# Patient Record
Sex: Female | Born: 1935 | Race: White | Hispanic: No | Smoking: Former smoker
Health system: Southern US, Community
[De-identification: ages and names within clinical notes are randomized; demographics above are authoritative.]

## PROBLEM LIST (undated history)

## (undated) DIAGNOSIS — C801 Malignant (primary) neoplasm, unspecified: Secondary | ICD-10-CM

## (undated) DIAGNOSIS — J449 Chronic obstructive pulmonary disease, unspecified: Secondary | ICD-10-CM

## (undated) HISTORY — PX: CHOLECYSTECTOMY: SHX55

## (undated) HISTORY — PX: ABDOMINAL HYSTERECTOMY: SHX81

## (undated) HISTORY — PX: COLOSTOMY: SHX63

---

## 2018-02-02 ENCOUNTER — Emergency Department

## 2018-02-02 ENCOUNTER — Inpatient Hospital Stay
Admission: EM | Admit: 2018-02-02 | Discharge: 2018-02-06 | DRG: 388 | Disposition: A | Discharge status: Hospice | Attending: Internal Medicine | Admitting: Internal Medicine

## 2018-02-02 ENCOUNTER — Encounter: Payer: Self-pay | Admitting: Radiology

## 2018-02-02 ENCOUNTER — Other Ambulatory Visit: Payer: Self-pay

## 2018-02-02 DIAGNOSIS — L89151 Pressure ulcer of sacral region, stage 1: Secondary | ICD-10-CM | POA: Diagnosis present

## 2018-02-02 DIAGNOSIS — Z7952 Long term (current) use of systemic steroids: Secondary | ICD-10-CM

## 2018-02-02 DIAGNOSIS — K56609 Unspecified intestinal obstruction, unspecified as to partial versus complete obstruction: Secondary | ICD-10-CM | POA: Diagnosis not present

## 2018-02-02 DIAGNOSIS — K529 Noninfective gastroenteritis and colitis, unspecified: Secondary | ICD-10-CM | POA: Diagnosis present

## 2018-02-02 DIAGNOSIS — Z85048 Personal history of other malignant neoplasm of rectum, rectosigmoid junction, and anus: Secondary | ICD-10-CM

## 2018-02-02 DIAGNOSIS — R1084 Generalized abdominal pain: Secondary | ICD-10-CM

## 2018-02-02 DIAGNOSIS — Z681 Body mass index (BMI) 19 or less, adult: Secondary | ICD-10-CM

## 2018-02-02 DIAGNOSIS — Z933 Colostomy status: Secondary | ICD-10-CM

## 2018-02-02 DIAGNOSIS — Z79899 Other long term (current) drug therapy: Secondary | ICD-10-CM

## 2018-02-02 DIAGNOSIS — Z87891 Personal history of nicotine dependence: Secondary | ICD-10-CM

## 2018-02-02 DIAGNOSIS — Z66 Do not resuscitate: Secondary | ICD-10-CM | POA: Diagnosis present

## 2018-02-02 DIAGNOSIS — L89221 Pressure ulcer of left hip, stage 1: Secondary | ICD-10-CM | POA: Diagnosis present

## 2018-02-02 DIAGNOSIS — E860 Dehydration: Secondary | ICD-10-CM | POA: Diagnosis present

## 2018-02-02 DIAGNOSIS — R112 Nausea with vomiting, unspecified: Secondary | ICD-10-CM

## 2018-02-02 DIAGNOSIS — L8990 Pressure ulcer of unspecified site, unspecified stage: Secondary | ICD-10-CM

## 2018-02-02 DIAGNOSIS — E43 Unspecified severe protein-calorie malnutrition: Secondary | ICD-10-CM | POA: Diagnosis present

## 2018-02-02 DIAGNOSIS — J449 Chronic obstructive pulmonary disease, unspecified: Secondary | ICD-10-CM | POA: Diagnosis present

## 2018-02-02 DIAGNOSIS — Z7951 Long term (current) use of inhaled steroids: Secondary | ICD-10-CM

## 2018-02-02 DIAGNOSIS — R0902 Hypoxemia: Secondary | ICD-10-CM | POA: Diagnosis present

## 2018-02-02 DIAGNOSIS — E876 Hypokalemia: Secondary | ICD-10-CM | POA: Diagnosis present

## 2018-02-02 DIAGNOSIS — D638 Anemia in other chronic diseases classified elsewhere: Secondary | ICD-10-CM | POA: Diagnosis present

## 2018-02-02 DIAGNOSIS — C20 Malignant neoplasm of rectum: Secondary | ICD-10-CM | POA: Diagnosis present

## 2018-02-02 DIAGNOSIS — Z9981 Dependence on supplemental oxygen: Secondary | ICD-10-CM

## 2018-02-02 DIAGNOSIS — R1110 Vomiting, unspecified: Secondary | ICD-10-CM | POA: Diagnosis not present

## 2018-02-02 DIAGNOSIS — J180 Bronchopneumonia, unspecified organism: Secondary | ICD-10-CM | POA: Diagnosis present

## 2018-02-02 DIAGNOSIS — L89211 Pressure ulcer of right hip, stage 1: Secondary | ICD-10-CM | POA: Diagnosis present

## 2018-02-02 HISTORY — DX: Malignant (primary) neoplasm, unspecified: C80.1

## 2018-02-02 LAB — COMPREHENSIVE METABOLIC PANEL
ALT: 31 U/L (ref 0–44)
ANION GAP: 8 (ref 5–15)
AST: 25 U/L (ref 15–41)
Albumin: 3.3 g/dL — ABNORMAL LOW (ref 3.5–5.0)
Alkaline Phosphatase: 98 U/L (ref 38–126)
BILIRUBIN TOTAL: 0.6 mg/dL (ref 0.3–1.2)
BUN: 16 mg/dL (ref 8–23)
CALCIUM: 7.7 mg/dL — AB (ref 8.9–10.3)
CHLORIDE: 92 mmol/L — AB (ref 98–111)
CO2: 40 mmol/L — AB (ref 22–32)
Creatinine, Ser: 0.6 mg/dL (ref 0.44–1.00)
GFR calc Af Amer: >60 mL/min (ref >60)
Glucose, Bld: 127 mg/dL — ABNORMAL HIGH (ref 70–99)
Potassium: 3.1 mmol/L — ABNORMAL LOW (ref 3.5–5.1)
Sodium: 140 mmol/L (ref 135–145)
Total Protein: 6.6 g/dL (ref 6.5–8.1)

## 2018-02-02 LAB — CBC WITH DIFFERENTIAL/PLATELET
BASOS ABS: 0 10*3/uL (ref 0–0.1)
Basophils Relative: 0 %
Eosinophils Absolute: 0 10*3/uL (ref 0–0.7)
Eosinophils Relative: 0 %
HEMATOCRIT: 34.6 % — AB (ref 35.0–47.0)
Hemoglobin: 11.3 g/dL — ABNORMAL LOW (ref 12.0–16.0)
LYMPHS ABS: 0.7 10*3/uL — AB (ref 1.0–3.6)
Lymphocytes Relative: 4 %
MCH: 26.1 pg (ref 26.0–34.0)
MCHC: 32.7 g/dL (ref 32.0–36.0)
MCV: 80 fL (ref 80.0–100.0)
MONO ABS: 1.1 10*3/uL — AB (ref 0.2–0.9)
Monocytes Relative: 6 %
NEUTROS ABS: 16.5 10*3/uL — AB (ref 1.4–6.5)
Neutrophils Relative %: 90 %
Platelets: 290 10*3/uL (ref 150–440)
RBC: 4.32 MIL/uL (ref 3.80–5.20)
RDW: 15.1 % — AB (ref 11.5–14.5)
WBC: 18.3 10*3/uL — ABNORMAL HIGH (ref 3.6–11.0)

## 2018-02-02 LAB — LIPASE, BLOOD: LIPASE: 34 U/L (ref 11–51)

## 2018-02-02 LAB — LACTIC ACID, PLASMA: LACTIC ACID, VENOUS: 0.8 mmol/L (ref 0.5–1.9)

## 2018-02-02 MED ORDER — IOHEXOL 300 MG/ML  SOLN
75.0000 mL | Freq: Once | INTRAMUSCULAR | Status: AC | PRN
  Administered 2018-02-02: 75 mL via INTRAVENOUS

## 2018-02-02 MED ORDER — ONDANSETRON HCL 4 MG/2ML IJ SOLN
4.0000 mg | Freq: Once | INTRAMUSCULAR | Status: AC
  Administered 2018-02-02: 4 mg via INTRAVENOUS
  Filled 2018-02-02: qty 2

## 2018-02-02 MED ORDER — SODIUM CHLORIDE 0.9 % IV BOLUS
1000.0000 mL | Freq: Once | INTRAVENOUS | Status: AC
  Administered 2018-02-02: 1000 mL via INTRAVENOUS

## 2018-02-02 NOTE — ED Provider Notes (Signed)
[facility] Emergency Department Provider Note  ____________________________________________  Time seen: Approximately 10:56 PM  I have reviewed the triage vital signs and the nursing notes.   HISTORY  Chief Complaint Emesis   HPI Erin Horn is a 84 y.o. female with h/o rectal cancer on hospice who presents for evaluation of abdominal pain.  Patient was sent here by her  hospice nurse for concerns of an SBO.  Patient has had several episodes of nonbloody nonbilious emesis that started this afternoon, associated with diffuse intermittent sharp cramping abdominal pain and decreased ostomy output.  Patient describes no ostomy output since this morning.  No fever or chills.  At this time she reports that her pain is very mild.  No prior history of SBO.  No dysuria or hematuria.  Patient denies chest pain or shortness of breath.  She has had a mild cough which is dry.  PMH Rectal cancer  Allergies Patient has no allergy information on record.  No family history on file.  Social History Smoking - none Alcohol - no Drugs - no  Review of Systems  Constitutional: Negative for fever. Eyes: Negative for visual changes. ENT: Negative for sore throat. Neck: No neck pain  Cardiovascular: Negative for chest pain. Respiratory: Negative for shortness of breath. + cough Gastrointestinal: + abdominal pain, vomiting, and decreased ostomy output. No diarrhea. Genitourinary: Negative for dysuria. Musculoskeletal: Negative for back pain. Skin: Negative for rash. Neurological: Negative for headaches, weakness or numbness. Psych: No SI or HI  ____________________________________________   PHYSICAL EXAM:  VITAL SIGNS: ED Triage Vitals [02/02/18 2154]  Enc Vitals Group     BP (!) 143/64     Pulse Rate (!) 112     Resp 20     Temp 98.6 F (37 C)     Temp Source Oral     SpO2 90 %     Weight 95 lb (43.1 kg)     Height 5\' 3"  (1.6 m)     Head Circumference        Peak Flow      Pain Score 5     Pain Loc      Pain Edu?      Excl. in [place]?     Constitutional: Alert and oriented. Well appearing and in no apparent distress. HEENT:      Head: Normocephalic and atraumatic.         Eyes: Conjunctivae are normal. Sclera is non-icteric.       Mouth/Throat: Mucous membranes are moist.       Neck: Supple with no signs of meningismus. Cardiovascular: Tachycardic with regular rhythm. No murmurs, gallops, or rubs. 2+ symmetrical distal pulses are present in all extremities. No JVD. Respiratory: Normal respiratory effort. Lungs are clear to auscultation bilaterally. No wheezes, crackles, or rhonchi.  Gastrointestinal: Soft, non tender, and non distended with positive bowel sounds. No rebound or guarding. Ostomy bag with no stool Musculoskeletal: Nontender with normal range of motion in all extremities. No edema, cyanosis, or erythema of extremities. Neurologic: Normal speech and language. Face is symmetric. Moving all extremities. No gross focal neurologic deficits are appreciated. Skin: Skin is warm, dry and intact. No rash noted. Psychiatric: Mood and affect are normal. Speech and behavior are normal.  ____________________________________________   LABS (all labs ordered are listed, but only abnormal results are displayed)  Labs Reviewed  CBC WITH DIFFERENTIAL/PLATELET - Abnormal; Notable for the following components:      Result Value  WBC 18.3 (*)    Hemoglobin 11.3 (*)    HCT 34.6 (*)    RDW 15.1 (*)    Neutro Abs 16.5 (*)    Lymphs Abs 0.7 (*)    Monocytes Absolute 1.1 (*)    All other components within normal limits  COMPREHENSIVE METABOLIC PANEL  LIPASE, BLOOD  LACTIC ACID, PLASMA  URINALYSIS, COMPLETE (UACMP) WITH MICROSCOPIC   ____________________________________________  EKG  ED ECG REPORT I, Rudene Re, the attending physician, personally viewed and interpreted this ECG.  Sinus tachycardia, rate of 102, normal  intervals, LVH, normal axis, no ST elevations or depressions.  No prior for comparison. ____________________________________________  RADIOLOGY  CT a/p: PND ____________________________________________   PROCEDURES  Procedure(s) performed: None Procedures Critical Care performed:  None ____________________________________________   INITIAL IMPRESSION / ASSESSMENT AND PLAN / ED COURSE  84 y.o. female with h/o rectal cancer on hospice who presents for evaluation of abdominal pain, nausea, several episodes of nonbloody nonbilious emesis and decreased ostomy output.  Patient is tachycardic but otherwise vitals are within normal limits, abdomen is flat with positive bowel sounds and no tenderness to palpation.  Ostomy bag is empty.  EKG showing sinus tachycardia with no evidence of ischemia.  Labs including CBC, CMP, lipase, UA, and lactic are pending. Will give zofran and IVF. CT a/p pending to eval for SBO. Care transferred to Dr. Dahlia Client      As part of my medical decision making, I reviewed the following data within the [place] notes reviewed and incorporated, Labs reviewed , EKG interpreted , Old chart reviewed, Notes from prior ED visits and [place] Controlled Substance Database    Pertinent labs & imaging results that were available during my care of the patient were reviewed by me and considered in my medical decision making (see chart for details).    ____________________________________________   FINAL CLINICAL IMPRESSION(S) / ED DIAGNOSES  Final diagnoses:  Generalized abdominal pain  Non-intractable vomiting with nausea, unspecified vomiting type      NEW MEDICATIONS STARTED DURING THIS VISIT:  ED Discharge Orders    None       Note:  This document was prepared using Dragon voice recognition software and may include unintentional dictation errors.    Alfred Levins, [place], MD 02/02/18 [phone]

## 2018-02-02 NOTE — ED Notes (Signed)
Resumed care from samnatha rn.  Pt alert. Family with pt.  Iv in place.

## 2018-02-02 NOTE — ED Triage Notes (Signed)
Pt in with co vomiting since today and mid abd pain. Pt has a colostomy due to rectal cancer, states has had decreased stool noted in bag.  Sent here by hospice nurse for possible bowel obstruction.

## 2018-02-02 NOTE — ED Notes (Signed)
Report given to Amy RN.

## 2018-02-02 NOTE — ED Notes (Signed)
Patient transported to CT 

## 2018-02-03 ENCOUNTER — Inpatient Hospital Stay

## 2018-02-03 ENCOUNTER — Encounter: Payer: Self-pay | Admitting: Internal Medicine

## 2018-02-03 DIAGNOSIS — R0902 Hypoxemia: Secondary | ICD-10-CM | POA: Diagnosis present

## 2018-02-03 DIAGNOSIS — E43 Unspecified severe protein-calorie malnutrition: Secondary | ICD-10-CM | POA: Diagnosis present

## 2018-02-03 DIAGNOSIS — Z87891 Personal history of nicotine dependence: Secondary | ICD-10-CM | POA: Diagnosis not present

## 2018-02-03 DIAGNOSIS — C20 Malignant neoplasm of rectum: Secondary | ICD-10-CM | POA: Diagnosis present

## 2018-02-03 DIAGNOSIS — E876 Hypokalemia: Secondary | ICD-10-CM | POA: Diagnosis present

## 2018-02-03 DIAGNOSIS — Z9981 Dependence on supplemental oxygen: Secondary | ICD-10-CM | POA: Diagnosis not present

## 2018-02-03 DIAGNOSIS — D638 Anemia in other chronic diseases classified elsewhere: Secondary | ICD-10-CM | POA: Diagnosis present

## 2018-02-03 DIAGNOSIS — K56609 Unspecified intestinal obstruction, unspecified as to partial versus complete obstruction: Secondary | ICD-10-CM | POA: Diagnosis present

## 2018-02-03 DIAGNOSIS — Z85048 Personal history of other malignant neoplasm of rectum, rectosigmoid junction, and anus: Secondary | ICD-10-CM | POA: Diagnosis not present

## 2018-02-03 DIAGNOSIS — L89151 Pressure ulcer of sacral region, stage 1: Secondary | ICD-10-CM | POA: Diagnosis present

## 2018-02-03 DIAGNOSIS — Z681 Body mass index (BMI) 19 or less, adult: Secondary | ICD-10-CM | POA: Diagnosis not present

## 2018-02-03 DIAGNOSIS — J180 Bronchopneumonia, unspecified organism: Secondary | ICD-10-CM | POA: Diagnosis present

## 2018-02-03 DIAGNOSIS — Z7952 Long term (current) use of systemic steroids: Secondary | ICD-10-CM | POA: Diagnosis not present

## 2018-02-03 DIAGNOSIS — Z933 Colostomy status: Secondary | ICD-10-CM | POA: Diagnosis not present

## 2018-02-03 DIAGNOSIS — Z79899 Other long term (current) drug therapy: Secondary | ICD-10-CM | POA: Diagnosis not present

## 2018-02-03 DIAGNOSIS — J449 Chronic obstructive pulmonary disease, unspecified: Secondary | ICD-10-CM | POA: Diagnosis present

## 2018-02-03 DIAGNOSIS — K529 Noninfective gastroenteritis and colitis, unspecified: Secondary | ICD-10-CM | POA: Diagnosis present

## 2018-02-03 DIAGNOSIS — R1110 Vomiting, unspecified: Secondary | ICD-10-CM | POA: Diagnosis present

## 2018-02-03 DIAGNOSIS — E860 Dehydration: Secondary | ICD-10-CM | POA: Diagnosis present

## 2018-02-03 DIAGNOSIS — L89221 Pressure ulcer of left hip, stage 1: Secondary | ICD-10-CM | POA: Diagnosis present

## 2018-02-03 DIAGNOSIS — Z66 Do not resuscitate: Secondary | ICD-10-CM | POA: Diagnosis present

## 2018-02-03 DIAGNOSIS — L8990 Pressure ulcer of unspecified site, unspecified stage: Secondary | ICD-10-CM

## 2018-02-03 DIAGNOSIS — Z7951 Long term (current) use of inhaled steroids: Secondary | ICD-10-CM | POA: Diagnosis not present

## 2018-02-03 DIAGNOSIS — L89211 Pressure ulcer of right hip, stage 1: Secondary | ICD-10-CM | POA: Diagnosis present

## 2018-02-03 LAB — URINALYSIS, COMPLETE (UACMP) WITH MICROSCOPIC
Bacteria, UA: NONE SEEN
Bilirubin Urine: NEGATIVE
Glucose, UA: NEGATIVE mg/dL
Ketones, ur: NEGATIVE mg/dL
Nitrite: NEGATIVE
Protein, ur: 30 mg/dL — AB
Specific Gravity, Urine: 1.038 — ABNORMAL HIGH (ref 1.005–1.030)
pH: 6 (ref 5.0–8.0)

## 2018-02-03 LAB — PREALBUMIN: Prealbumin: 16.2 mg/dL — ABNORMAL LOW (ref 18–38)

## 2018-02-03 LAB — MAGNESIUM: Magnesium: 1.1 mg/dL — ABNORMAL LOW (ref 1.7–2.4)

## 2018-02-03 MED ORDER — SENNOSIDES-DOCUSATE SODIUM 8.6-50 MG PO TABS: 1.0000 | ORAL_TABLET | Freq: Every evening | ORAL | Status: DC | PRN

## 2018-02-03 MED ORDER — IPRATROPIUM-ALBUTEROL 0.5-2.5 (3) MG/3ML IN SOLN
3.0000 mL | RESPIRATORY_TRACT | Status: DC | PRN
  Administered 2018-02-03 – 2018-02-06 (×7): 3 mL via RESPIRATORY_TRACT
  Filled 2018-02-03 (×7): qty 3

## 2018-02-03 MED ORDER — ENOXAPARIN SODIUM 30 MG/0.3ML ~~LOC~~ SOLN: 30.0000 mg | SUBCUTANEOUS | Status: DC

## 2018-02-03 MED ORDER — LORAZEPAM 2 MG/ML IJ SOLN
0.5000 mg | INTRAMUSCULAR | Status: DC | PRN
  Administered 2018-02-04: 03:00:00 1 mg via INTRAVENOUS
  Administered 2018-02-05: 0.5 mg via INTRAVENOUS
  Filled 2018-02-03 (×2): qty 1

## 2018-02-03 MED ORDER — POTASSIUM CHLORIDE 10 MEQ/100ML IV SOLN
10.0000 meq | INTRAVENOUS | Status: AC
  Administered 2018-02-03 (×4): 10 meq via INTRAVENOUS
  Filled 2018-02-03 (×3): qty 100

## 2018-02-03 MED ORDER — LORAZEPAM 0.5 MG PO TABS: 0.5000 mg | ORAL_TABLET | ORAL | Status: DC | PRN

## 2018-02-03 MED ORDER — MORPHINE SULFATE ER 15 MG PO TBCR: 15.0000 mg | EXTENDED_RELEASE_TABLET | Freq: Three times a day (TID) | ORAL | Status: DC

## 2018-02-03 MED ORDER — CIPROFLOXACIN IN D5W 200 MG/100ML IV SOLN
200.0000 mg | Freq: Two times a day (BID) | INTRAVENOUS | Status: DC
  Administered 2018-02-03 – 2018-02-05 (×4): 200 mg via INTRAVENOUS
  Filled 2018-02-03 (×5): qty 100

## 2018-02-03 MED ORDER — ENOXAPARIN SODIUM 40 MG/0.4ML ~~LOC~~ SOLN: 40.0000 mg | SUBCUTANEOUS | Status: DC

## 2018-02-03 MED ORDER — HYDROMORPHONE HCL 1 MG/ML IJ SOLN
0.5000 mg | INTRAMUSCULAR | Status: DC | PRN
  Administered 2018-02-03 – 2018-02-05 (×12): 1 mg via INTRAVENOUS
  Filled 2018-02-03 (×12): qty 1

## 2018-02-03 MED ORDER — ACETAMINOPHEN 650 MG RE SUPP: 650.0000 mg | Freq: Four times a day (QID) | RECTAL | Status: DC | PRN

## 2018-02-03 MED ORDER — ORAL CARE MOUTH RINSE
15.0000 mL | Freq: Two times a day (BID) | OROMUCOSAL | Status: DC
  Administered 2018-02-03 – 2018-02-04 (×3): 15 mL via OROMUCOSAL

## 2018-02-03 MED ORDER — GABAPENTIN 100 MG PO CAPS: 200.0000 mg | ORAL_CAPSULE | Freq: Two times a day (BID) | ORAL | Status: DC

## 2018-02-03 MED ORDER — AMLODIPINE BESYLATE 10 MG PO TABS: 10.0000 mg | ORAL_TABLET | Freq: Every day | ORAL | Status: DC

## 2018-02-03 MED ORDER — LACTATED RINGERS IV SOLN
INTRAVENOUS | Status: DC
  Administered 2018-02-03: 04:00:00 via INTRAVENOUS

## 2018-02-03 MED ORDER — MONTELUKAST SODIUM 10 MG PO TABS: 10.0000 mg | ORAL_TABLET | Freq: Every day | ORAL | Status: DC

## 2018-02-03 MED ORDER — AMPICILLIN-SULBACTAM SODIUM 1.5 (1-0.5) G IJ SOLR
1.5000 g | Freq: Three times a day (TID) | INTRAMUSCULAR | Status: DC
  Filled 2018-02-03 (×3): qty 1.5

## 2018-02-03 MED ORDER — POTASSIUM CHLORIDE 10 MEQ/100ML IV SOLN
10.0000 meq | INTRAVENOUS | Status: AC
  Administered 2018-02-03 (×2): 10 meq via INTRAVENOUS
  Filled 2018-02-03 (×2): qty 100

## 2018-02-03 MED ORDER — HALOPERIDOL LACTATE 2 MG/ML PO CONC: 1.0000 mg | ORAL | Status: DC | PRN

## 2018-02-03 MED ORDER — METRONIDAZOLE IN NACL 5-0.79 MG/ML-% IV SOLN
500.0000 mg | Freq: Three times a day (TID) | INTRAVENOUS | Status: DC
  Administered 2018-02-03 – 2018-02-05 (×6): 500 mg via INTRAVENOUS
  Filled 2018-02-03 (×8): qty 100

## 2018-02-03 MED ORDER — MORPHINE SULFATE (CONCENTRATE) 10 MG/0.5ML PO SOLN
10.0000 mg | ORAL | Status: DC | PRN
Start: 2018-02-03 — End: 2018-02-03

## 2018-02-03 MED ORDER — MAGNESIUM SULFATE 4 GM/100ML IV SOLN
4.0000 g | Freq: Once | INTRAVENOUS | Status: AC
  Administered 2018-02-03: 14:00:00 4 g via INTRAVENOUS
  Filled 2018-02-03: qty 100

## 2018-02-03 MED ORDER — ACETAMINOPHEN 325 MG PO TABS: 650.0000 mg | ORAL_TABLET | Freq: Four times a day (QID) | ORAL | Status: DC | PRN

## 2018-02-03 MED ORDER — PREDNISONE 20 MG PO TABS: 20.0000 mg | ORAL_TABLET | Freq: Every day | ORAL | Status: DC

## 2018-02-03 MED ORDER — ORAL CARE MOUTH RINSE
15.0000 mL | Freq: Two times a day (BID) | OROMUCOSAL | Status: DC
  Administered 2018-02-03 (×2): 15 mL via OROMUCOSAL

## 2018-02-03 MED ORDER — BISACODYL 5 MG PO TBEC: 5.0000 mg | DELAYED_RELEASE_TABLET | Freq: Every day | ORAL | Status: DC | PRN

## 2018-02-03 MED ORDER — ONDANSETRON HCL 4 MG PO TABS: 4.0000 mg | ORAL_TABLET | Freq: Four times a day (QID) | ORAL | Status: DC | PRN

## 2018-02-03 MED ORDER — ONDANSETRON HCL 4 MG/2ML IJ SOLN
4.0000 mg | Freq: Four times a day (QID) | INTRAMUSCULAR | Status: DC | PRN
  Administered 2018-02-05: 4 mg via INTRAVENOUS
  Filled 2018-02-03 (×2): qty 2

## 2018-02-03 MED ORDER — HYOSCYAMINE SULFATE 0.125 MG SL SUBL
0.1250 mg | SUBLINGUAL_TABLET | SUBLINGUAL | Status: DC | PRN
  Filled 2018-02-03: qty 1

## 2018-02-03 MED ORDER — POTASSIUM CHLORIDE CRYS ER 10 MEQ PO TBCR: 10.0000 meq | EXTENDED_RELEASE_TABLET | Freq: Two times a day (BID) | ORAL | Status: DC

## 2018-02-03 NOTE — Progress Notes (Signed)
Lovenox changed to 30 mg daily for TBW <45 kg.

## 2018-02-03 NOTE — Plan of Care (Signed)
  Problem: Education: Goal: Knowledge of General Education information will improve Description Including pain rating scale, medication(s)/side effects and non-pharmacologic comfort measures Outcome: Progressing   Problem: Pain Managment: Goal: General experience of comfort will improve Outcome: Progressing   Problem: Safety: Goal: Ability to remain free from injury will improve Outcome: Progressing   Problem: Skin Integrity: Goal: Risk for impaired skin integrity will decrease Outcome: Progressing   Problem: Coping: Goal: Ability to identify and develop effective coping behavior will improve Outcome: Progressing

## 2018-02-03 NOTE — ED Notes (Signed)
Ng tube inserted without diff into left nares.  Connected to low suction, draining yellow green bile.  Pt tolerated  Well.  Family with pt  Pt waiitng on admission.

## 2018-02-03 NOTE — H&P (Signed)
[facility] NAME: Erin Horn    MR#:  [id number]  DATE OF BIRTH:  06/27/1929  DATE OF ADMISSION:  02/02/2018  PRIMARY CARE PHYSICIAN: Patient, No Pcp Per   REQUESTING/REFERRING PHYSICIAN: Loney Hering, MD  CHIEF COMPLAINT:   Chief Complaint  Patient presents with  . Emesis    HISTORY OF PRESENT ILLNESS:  Erin Horn  is a 84 y.o. female with a known history of COPD (2L [place] chronic home O2), long-standing Hx rectal Ca (w/ colostomy) p/w AP/N/V. Pt lived in [place], and moved to [place] with her family ~3wks ago. She apparently had radiation therapy for rectal mass last year, but refused subsequent chemoTx. As a result, the radiation shrank the mass, but the mass regained size in the interrim. In 08/2017, colostomy was done for comfort. Pt was in rehab x52mo. In the interim, it seems she has been told by her Oncologists that there are no further treatment options available. As such, has been receiving home care, though she has not received a designation of terminal disease w/ 36mo life expectancy (hospice) as yet. Pt's family tells me that over weekend, pt had small qty rectal bleeding as well as bleeding into ostomy bag. She had diffuse crampy AP and N/V episodes xTNTC, (-) blood. Despite N/V, pt has still had good appetite, but has not been able to keep anything down. Vomitus characterized as dark/malodorous, description suggestive of feculent vomiting. CT A/P (+) "Multiple dilated small bowel loops with multiple air-fluid levels and diffuse small bowel wall thickening. Changes likely to indicate small bowel obstruction with enteritis. Transition zone is not identified." NGT placed.  PAST MEDICAL HISTORY:   Past Medical History:  Diagnosis Date  . Cancer ([place])     PAST SURGICAL HISTORY:  History reviewed. No pertinent surgical history.  SOCIAL HISTORY:   Social History   Tobacco Use  . Smoking status: Former Smoker     Types: Cigarettes  . Smokeless tobacco: Never Used  Substance Use Topics  . Alcohol use: Not Currently    FAMILY HISTORY:  History reviewed. No pertinent family history.  DRUG ALLERGIES:  No Known Allergies  REVIEW OF SYSTEMS:   Review of Systems  Constitutional: Negative for chills, diaphoresis, fever, malaise/fatigue and weight loss.  HENT: Negative for congestion, ear pain, hearing loss, nosebleeds, sinus pain, sore throat and tinnitus.   Eyes: Negative for blurred vision, double vision and photophobia.  Respiratory: Negative for cough, hemoptysis, sputum production, shortness of breath and wheezing.   Cardiovascular: Negative for chest pain, palpitations, orthopnea, claudication, leg swelling and PND.  Gastrointestinal: Positive for abdominal pain, melena, nausea and vomiting. Negative for constipation, diarrhea and heartburn.  Genitourinary: Negative for dysuria, flank pain, frequency, hematuria and urgency.  Musculoskeletal: Negative for back pain, joint pain, myalgias and neck pain.  Skin: Negative for itching and rash.  Neurological: Negative for dizziness, tingling, tremors, sensory change, speech change, focal weakness, seizures, loss of consciousness, weakness and headaches.  Psychiatric/Behavioral: Negative for memory loss. The patient does not have insomnia.    MEDICATIONS AT HOME:   Prior to Admission medications   Medication Sig Start Date End Date Taking? Authorizing Provider  acetaminophen (TYLENOL) 325 MG tablet Take 650 mg by mouth every 6 (six) hours as needed.   Yes [provider]  albuterol (PROVENTIL) (2.5 MG/3ML) 0.083% nebulizer solution Take 2.5 mg by nebulization every 6 (six) hours as needed for wheezing or shortness of breath.  Yes [provider]  amLODipine (NORVASC) 10 MG tablet Take 10 mg by mouth daily.   Yes [provider]  fluticasone (FLONASE) 50 MCG/ACT nasal spray Place 2 sprays into both nostrils daily.   Yes  [provider]  furosemide (LASIX) 40 MG tablet Take 40 mg by mouth daily.   Yes [provider]  gabapentin (NEURONTIN) 100 MG capsule Take 200 mg by mouth 2 (two) times daily.   Yes [provider]  haloperidol (HALDOL) 2 MG/ML solution Take 1 mg by mouth every 4 (four) hours as needed for agitation.   Yes [provider]  hyoscyamine (LEVSIN SL) 0.125 MG SL tablet Place 0.125 mg under the tongue every 4 (four) hours as needed.   Yes [provider]  ipratropium (ATROVENT) 0.02 % nebulizer solution Take 0.5 mg by nebulization every 6 (six) hours as needed for wheezing or shortness of breath.   Yes [provider]  loratadine (CLARITIN) 10 MG tablet Take 10 mg by mouth daily.   Yes [provider]  LORazepam (ATIVAN) 0.5 MG tablet Take 0.5 mg by mouth every 4 (four) hours as needed for anxiety.   Yes [provider]  montelukast (SINGULAIR) 10 MG tablet Take 10 mg by mouth at bedtime.   Yes [provider]  morphine (MS CONTIN) 15 MG 12 hr tablet Take 15 mg by mouth 3 (three) times daily.   Yes [provider]  Morphine Sulfate (MORPHINE CONCENTRATE) 10 mg / 0.5 ml concentrated solution Take 10-20 mg by mouth every 2 (two) hours as needed for severe pain.   Yes [provider]  potassium chloride (K-DUR,KLOR-CON) 10 MEQ tablet Take 10 mEq by mouth 2 (two) times daily.   Yes [provider]  predniSONE (DELTASONE) 10 MG tablet Take 20 mg by mouth daily with breakfast.   Yes [provider]  sennosides-docusate sodium (SENOKOT-S) 8.6-50 MG tablet Take 1-2 tablets by mouth daily as needed for constipation.   Yes [provider]      VITAL SIGNS:  Blood pressure (!) 130/58, pulse 94, temperature 98.5 F (36.9 C), temperature source Oral, resp. rate 18, height 5' (1.524 m), weight 42.2 kg (93 lb 1.6 oz), SpO2 94 %.  PHYSICAL EXAMINATION:  Physical Exam  Constitutional: She  is oriented to person, place, and time. She appears well-developed. She appears cachectic. She is active and cooperative.  Non-toxic appearance. She has a sickly appearance. She appears ill. No distress. She is not intubated. Nasal cannula in place.  HENT:  Head: Normocephalic and atraumatic.  Mouth/Throat: Oropharynx is clear and moist. No oropharyngeal exudate.  Eyes: Conjunctivae, EOM and lids are normal. No scleral icterus.  Neck: Neck supple. No JVD present. No thyromegaly present.  Cardiovascular: Normal rate, regular rhythm, S1 normal, S2 normal and normal heart sounds.  No extrasystoles are present. Exam reveals no gallop, no S3, no S4, no distant heart sounds and no friction rub.  No murmur heard. Pulmonary/Chest: Effort normal. No accessory muscle usage or stridor. No apnea, no tachypnea and no bradypnea. She is not intubated. No respiratory distress. She has decreased breath sounds in the right upper field, the right middle field, the right lower field, the left upper field, the left middle field and the left lower field. She has no wheezes. She has no rhonchi. She has no rales.  Abdominal: Soft. She exhibits no distension. Bowel sounds are decreased. There is no tenderness. There is no rebound and no guarding.  (+)  colostomy  Musculoskeletal: Normal range of motion. She exhibits no edema or tenderness.  Lymphadenopathy:    She has no cervical adenopathy.  Neurological: She is alert and oriented to person, place, and time. She is not disoriented.  Skin: Skin is warm and dry. No rash noted. She is not diaphoretic. No erythema.  Psychiatric: She has a normal mood and affect. Her speech is normal and behavior is normal. Judgment and thought content normal. Cognition and memory are normal.   LABORATORY PANEL:   CBC Recent Labs  Lab 02/02/18 2245  WBC 18.3*  HGB 11.3*  HCT 34.6*  PLT 290    ------------------------------------------------------------------------------------------------------------------  Chemistries  Recent Labs  Lab 02/02/18 2245  NA 140  K 3.1*  CL 92*  CO2 40*  GLUCOSE 127*  BUN 16  CREATININE 0.60  CALCIUM 7.7*  AST 25  ALT 31  ALKPHOS 98  BILITOT 0.6   ------------------------------------------------------------------------------------------------------------------  Cardiac Enzymes No results for input(s): TROPONINI in the last 168 hours. ------------------------------------------------------------------------------------------------------------------  RADIOLOGY:  Dg Abdomen 1 View  Result Date: 02/03/2018 CLINICAL DATA:  NG tube placement EXAM: ABDOMEN - 1 VIEW COMPARISON:  None. FINDINGS: The enteric tube is present with tip over the left upper quadrant consistent with location in the body of the stomach. Cardiac enlargement. Emphysematous changes in the lungs. No blunting of costophrenic angles. No visible pneumothorax. Mediastinal contours appear intact. Residual contrast material in the renal collecting systems. IMPRESSION: Enteric tube tip is in the left upper quadrant consistent with location in the body of the stomach. Electronically Signed   By: Lucienne Capers M.D.   On: 02/03/2018 03:14   Ct Abdomen Pelvis W Contrast  Result Date: 02/03/2018 CLINICAL DATA:  Vomiting and mid abdominal pain today. Colostomy due to rectal cancer. Decreased stool in the bag. EXAM: CT ABDOMEN AND PELVIS WITH CONTRAST TECHNIQUE: Multidetector CT imaging of the abdomen and pelvis was performed using the standard protocol following bolus administration of intravenous contrast. CONTRAST:  33mL OMNIPAQUE IOHEXOL 300 MG/ML  SOLN COMPARISON:  None. FINDINGS: Lower chest: Emphysematous changes and fibrosis in the lungs. Nodular peribronchial infiltrates bilaterally but more prominent on the right. This likely indicates bronchopneumonia. Small esophageal hiatal  hernia. Hepatobiliary: Moderate bile duct dilatation with prominent pneumobilia, likely postoperative. Surgical absence of the gallbladder. No focal liver lesions identified. No portal venous gas. Pancreas: Scattered pancreatic calcifications likely indicating chronic pancreatitis. No acute inflammatory infiltration is identified. No pancreatic ductal dilatation. Spleen: Normal in size without focal abnormality. Adrenals/Urinary Tract: Adrenal glands are unremarkable. Kidneys are normal, without renal calculi, focal lesion, or hydronephrosis. Bladder is decompressed. Stomach/Bowel: Partial colonic resection with descending colostomy in the left lower quadrant. Remaining colon is stool filled with mild distention. Multiple dilated small bowel loops with multiple air-fluid levels and diffuse small bowel wall thickening. No pneumatosis. Changes likely to indicate small bowel obstruction with enteritis. Transition zone is not identified. Flow is demonstrated in the mesenteric arteries and veins. Bowel ischemia less likely. Vascular/Lymphatic: Extensive aortic and branch vessel calcifications. Probably at least moderate stenosis of the iliac arteries bilaterally. No aneurysm. No significant lymphadenopathy. Reproductive: Status post hysterectomy. No adnexal masses. Other: Small amount of free fluid in the pelvis is likely reactive. No free air. Musculoskeletal: Degenerative changes in the spine. Diffuse bone demineralization. Multiple vertebral compression fractures with kyphoplasty at L3 and L5. Changes likely represent osteoporosis. IMPRESSION: 1. Multiple dilated small bowel loops with multiple air-fluid levels and diffuse small bowel wall thickening. Changes likely to indicate small  bowel obstruction with enteritis. Transition zone is not identified. 2. Small amount of free fluid in the pelvis is likely reactive. 3. Emphysematous changes and fibrosis in the lungs. Nodular peribronchial infiltrates in the lung bases,  more prominent on the right, likely bronchopneumonia. 4. Extensive aortic and branch vessel calcifications. 5. Partial colonic resection with descending colostomy in the left lower quadrant. 6. Prominent pneumobilia, likely postoperative. 7. Diffuse bone demineralization with multiple vertebral compression fractures likely indicating osteoporosis. Emphysema (ICD10-J43.9). Electronically Signed   By: Lucienne Capers M.D.   On: 02/03/2018 00:11   IMPRESSION AND PLAN:   A/P: 51F p/w AP/N/V, CT A/P (+) SBO. Dehydration, hypokalemia, hyperglycemia, hypocalcemia, hypoalbuminemia, leukocytosis, normocytic anemia. -WBC 18.3, SIRS (+); afebrile, Lactate 0.8 -CT A/P (+) "Multiple dilated small bowel loops with multiple air-fluid levels and diffuse small bowel wall thickening. Changes likely to indicate small bowel obstruction with enteritis. Transition zone is not identified." -s/p NGT -IVF -Symptomatic mgmt, pain ctrl, antiemetics -Conservative mgmt -Will obtain blessing of General Surgery consultation service -Replete K+ (IV) -Mag level pending -Ionized calcium level pending -Prealbumin pending -Leukocytosis likely reactive -Normocytic anemia likely anemia of chronic disease; Hgb stable @ 11.3, no prior labwork for comparison; monitor -NPO -Lovenox -DNR/DNI -Admission, > 2 midnights   All the records are reviewed and case discussed with ED provider. Management plans discussed with the patient, family and they are in agreement.  CODE STATUS: DNR/DNI  TOTAL TIME TAKING CARE OF THIS PATIENT: 90 minutes.    Arta Silence M.D on 02/03/2018 at 5:44 AM  Between 7am to 6pm - Pager - [phone]  After 6pm go to [email or web address] - Proofreader  [facility]  Office  [phone]  CC: Primary care physician; Patient, No Pcp Per   Note: This dictation was prepared with Dragon dictation along with smaller phrase technology. Any transcriptional errors that  result from this process are unintentional.

## 2018-02-03 NOTE — Progress Notes (Signed)
[facility] [place] NAME: Erin Horn    MR#:  [id number]  DATE OF BIRTH:  04/12/1929  SUBJECTIVE:  CHIEF COMPLAINT:   Chief Complaint  Patient presents with  . Emesis   History of rectal cancer with colostomy and recently moved from [place].  Here with hospice services at home.  For the last few days has worsening oral intake with vomiting and crampy abdominal pain. Have hypoxia, Low K and High WBCs. On NG suction of SBO.  REVIEW OF SYSTEMS:  CONSTITUTIONAL: No fever,have fatigue or weakness.  EYES: No blurred or double vision.  EARS, NOSE, AND THROAT: No tinnitus or ear pain.  RESPIRATORY: No cough, shortness of breath, wheezing or hemoptysis.  CARDIOVASCULAR: No chest pain, orthopnea, edema.  GASTROINTESTINAL: No nausea, vomiting, diarrhea , have abdominal pain.  GENITOURINARY: No dysuria, hematuria.  ENDOCRINE: No polyuria, nocturia,  HEMATOLOGY: No anemia, easy bruising or bleeding SKIN: No rash or lesion. MUSCULOSKELETAL: No joint pain or arthritis.   NEUROLOGIC: No tingling, numbness, weakness.  PSYCHIATRY: No anxiety or depression.   ROS  DRUG ALLERGIES:  No Known Allergies  VITALS:  Blood pressure (!) 162/69, pulse (!) 102, temperature 97.8 F (36.6 C), temperature source Oral, resp. rate 18, height 5' (1.524 m), weight 42.2 kg (93 lb 1.6 oz), SpO2 94 %.  PHYSICAL EXAMINATION:  GENERAL:  84 y.o.-year-old patient lying in the bed with no acute distress.  EYES: Pupils equal, round, reactive to light and accommodation. No scleral icterus. Extraocular muscles intact.  HEENT: Head atraumatic, normocephalic. Oropharynx and nasopharynx clear.  NECK:  Supple, no jugular venous distention. No thyroid enlargement, no tenderness.  LUNGS: Normal breath sounds bilaterally, no wheezing, b/l crepitation. No use of accessory muscles of respiration. Have nasal canula oxygen. CARDIOVASCULAR: S1, S2 normal. No murmurs, rubs, or gallops.   ABDOMEN: Soft, nontender, nondistended. Bowel sounds present. No organomegaly or mass. Colostomy in place. EXTREMITIES: No pedal edema, cyanosis, or clubbing.  NEUROLOGIC: Cranial nerves II through XII are intact. Muscle strength 4/5 in all extremities. Sensation intact. Gait not checked.  PSYCHIATRIC: The patient is alert and oriented x 3.  SKIN: No obvious rash, lesion, or ulcer.   Physical Exam LABORATORY PANEL:   CBC Recent Labs  Lab 02/02/18 2245  WBC 18.3*  HGB 11.3*  HCT 34.6*  PLT 290   ------------------------------------------------------------------------------------------------------------------  Chemistries  Recent Labs  Lab 02/02/18 2245 02/03/18 0631  NA 140  --   K 3.1*  --   CL 92*  --   CO2 40*  --   GLUCOSE 127*  --   BUN 16  --   CREATININE 0.60  --   CALCIUM 7.7*  --   MG  --  1.1*  AST 25  --   ALT 31  --   ALKPHOS 98  --   BILITOT 0.6  --    ------------------------------------------------------------------------------------------------------------------  Cardiac Enzymes No results for input(s): TROPONINI in the last 168 hours. ------------------------------------------------------------------------------------------------------------------  RADIOLOGY:  Dg Abdomen 1 View  Result Date: 02/03/2018 CLINICAL DATA:  NG tube placement EXAM: ABDOMEN - 1 VIEW COMPARISON:  None. FINDINGS: The enteric tube is present with tip over the left upper quadrant consistent with location in the body of the stomach. Cardiac enlargement. Emphysematous changes in the lungs. No blunting of costophrenic angles. No visible pneumothorax. Mediastinal contours appear intact. Residual contrast material in the renal collecting systems. IMPRESSION: Enteric tube tip is in the left upper quadrant consistent with  location in the body of the stomach. Electronically Signed   By: Lucienne Capers M.D.   On: 02/03/2018 03:14   Ct Abdomen Pelvis W Contrast  Result Date:  02/03/2018 CLINICAL DATA:  Vomiting and mid abdominal pain today. Colostomy due to rectal cancer. Decreased stool in the bag. EXAM: CT ABDOMEN AND PELVIS WITH CONTRAST TECHNIQUE: Multidetector CT imaging of the abdomen and pelvis was performed using the standard protocol following bolus administration of intravenous contrast. CONTRAST:  36mL OMNIPAQUE IOHEXOL 300 MG/ML  SOLN COMPARISON:  None. FINDINGS: Lower chest: Emphysematous changes and fibrosis in the lungs. Nodular peribronchial infiltrates bilaterally but more prominent on the right. This likely indicates bronchopneumonia. Small esophageal hiatal hernia. Hepatobiliary: Moderate bile duct dilatation with prominent pneumobilia, likely postoperative. Surgical absence of the gallbladder. No focal liver lesions identified. No portal venous gas. Pancreas: Scattered pancreatic calcifications likely indicating chronic pancreatitis. No acute inflammatory infiltration is identified. No pancreatic ductal dilatation. Spleen: Normal in size without focal abnormality. Adrenals/Urinary Tract: Adrenal glands are unremarkable. Kidneys are normal, without renal calculi, focal lesion, or hydronephrosis. Bladder is decompressed. Stomach/Bowel: Partial colonic resection with descending colostomy in the left lower quadrant. Remaining colon is stool filled with mild distention. Multiple dilated small bowel loops with multiple air-fluid levels and diffuse small bowel wall thickening. No pneumatosis. Changes likely to indicate small bowel obstruction with enteritis. Transition zone is not identified. Flow is demonstrated in the mesenteric arteries and veins. Bowel ischemia less likely. Vascular/Lymphatic: Extensive aortic and branch vessel calcifications. Probably at least moderate stenosis of the iliac arteries bilaterally. No aneurysm. No significant lymphadenopathy. Reproductive: Status post hysterectomy. No adnexal masses. Other: Small amount of free fluid in the pelvis is  likely reactive. No free air. Musculoskeletal: Degenerative changes in the spine. Diffuse bone demineralization. Multiple vertebral compression fractures with kyphoplasty at L3 and L5. Changes likely represent osteoporosis. IMPRESSION: 1. Multiple dilated small bowel loops with multiple air-fluid levels and diffuse small bowel wall thickening. Changes likely to indicate small bowel obstruction with enteritis. Transition zone is not identified. 2. Small amount of free fluid in the pelvis is likely reactive. 3. Emphysematous changes and fibrosis in the lungs. Nodular peribronchial infiltrates in the lung bases, more prominent on the right, likely bronchopneumonia. 4. Extensive aortic and branch vessel calcifications. 5. Partial colonic resection with descending colostomy in the left lower quadrant. 6. Prominent pneumobilia, likely postoperative. 7. Diffuse bone demineralization with multiple vertebral compression fractures likely indicating osteoporosis. Emphysema (ICD10-J43.9). Electronically Signed   By: Lucienne Capers M.D.   On: 02/03/2018 00:11    ASSESSMENT AND PLAN:   Active Problems:   Small bowel obstruction (HCC)   Pressure injury of skin   Protein-calorie malnutrition, severe  *Small bowel obstruction NG tube suction, surgical consult.  *Hypokalemia Secondary to GI fluid loss due to obstruction. Replace IV and oral, magnesium low - replace  * Enteritis We will give IV Cipro and Flagyl.  *Possible bronchopneumonia There may be a component of aspiration also. Cipro and Flagyl might help take care of that.   All the records are reviewed and case discussed with Care Management/Social Workerr. Management plans discussed with the patient, family and they are in agreement.  CODE STATUS: DNR  TOTAL TIME TAKING CARE OF THIS PATIENT: 35 minutes.     POSSIBLE D/C IN 1-2 DAYS, DEPENDING ON CLINICAL CONDITION.   Vaughan Basta M.D on 02/03/2018   Between 7am to 6pm - Pager -  [phone]  After 6pm go to [email or web address] - password  [email or web address]  [facility]  Office  [phone]  CC: Primary care physician; Patient, No Pcp Per  Note: This dictation was prepared with Dragon dictation along with smaller phrase technology. Any transcriptional errors that result from this process are unintentional.

## 2018-02-03 NOTE — Consult Note (Signed)
SURGICAL CONSULTATION NOTE   HISTORY OF PRESENT ILLNESS (HPI):  84 y.o. female presented to [facility] ED for evaluation of abdominal pain, nausea and vomiting. Patient reports has pain on rectal area. Patient refers that started with the rectal pain around 5 days ago. Pain has been associated with nausea and vomiting, multiple episodes. Vomiting described as feculent. Pain does not radiatesDenies fever or chills.  Patient has surgical of cholecystectomy years ago. Most recent surgery was in February and it was the colostomy (palliative surgery)  Surgery is consulted by Dr. Jodell Cipro in this context for evaluation and management of small bowel obstruction.  PAST MEDICAL HISTORY (PMH):  Past Medical History:  Diagnosis Date  . Cancer ([place])      PAST SURGICAL HISTORY ([place]):  History reviewed. No pertinent surgical history.   MEDICATIONS:  Prior to Admission medications   Medication Sig Start Date End Date Taking? Authorizing Provider  acetaminophen (TYLENOL) 325 MG tablet Take 650 mg by mouth every 6 (six) hours as needed.   Yes [provider]  albuterol (PROVENTIL) (2.5 MG/3ML) 0.083% nebulizer solution Take 2.5 mg by nebulization every 6 (six) hours as needed for wheezing or shortness of breath.   Yes [provider]  amLODipine (NORVASC) 10 MG tablet Take 10 mg by mouth daily.   Yes [provider]  fluticasone (FLONASE) 50 MCG/ACT nasal spray Place 2 sprays into both nostrils daily.   Yes [provider]  furosemide (LASIX) 40 MG tablet Take 40 mg by mouth daily.   Yes [provider]  gabapentin (NEURONTIN) 100 MG capsule Take 200 mg by mouth 2 (two) times daily.   Yes [provider]  haloperidol (HALDOL) 2 MG/ML solution Take 1 mg by mouth every 4 (four) hours as needed for agitation.   Yes [provider]  hyoscyamine (LEVSIN SL) 0.125 MG SL tablet Place 0.125 mg under the tongue every 4 (four) hours as needed.   Yes  [provider]  ipratropium (ATROVENT) 0.02 % nebulizer solution Take 0.5 mg by nebulization every 6 (six) hours as needed for wheezing or shortness of breath.   Yes [provider]  loratadine (CLARITIN) 10 MG tablet Take 10 mg by mouth daily.   Yes [provider]  LORazepam (ATIVAN) 0.5 MG tablet Take 0.5 mg by mouth every 4 (four) hours as needed for anxiety.   Yes [provider]  montelukast (SINGULAIR) 10 MG tablet Take 10 mg by mouth at bedtime.   Yes [provider]  morphine (MS CONTIN) 15 MG 12 hr tablet Take 15 mg by mouth 3 (three) times daily.   Yes [provider]  Morphine Sulfate (MORPHINE CONCENTRATE) 10 mg / 0.5 ml concentrated solution Take 10-20 mg by mouth every 2 (two) hours as needed for severe pain.   Yes [provider]  potassium chloride (K-DUR,KLOR-CON) 10 MEQ tablet Take 10 mEq by mouth 2 (two) times daily.   Yes [provider]  predniSONE (DELTASONE) 10 MG tablet Take 20 mg by mouth daily with breakfast.   Yes [provider]  sennosides-docusate sodium (SENOKOT-S) 8.6-50 MG tablet Take 1-2 tablets by mouth daily as needed for constipation.   Yes [provider]     ALLERGIES:  No Known Allergies   SOCIAL HISTORY:  Social History   Socioeconomic History  . Marital status: Widowed    Spouse name: Not on file  . Number of children: Not on file  . Years of education: Not on  file  . Highest education level: Not on file  Occupational History  . Not on file  Social Needs  . Financial resource strain: Not on file  . Food insecurity:    Worry: Not on file    Inability: Not on file  . Transportation needs:    Medical: Not on file    Non-medical: Not on file  Tobacco Use  . Smoking status: Former Smoker    Types: Cigarettes  . Smokeless tobacco: Never Used  Substance and Sexual Activity  . Alcohol use: Not Currently  . Drug use: Never  . Sexual activity: Not on  file  Lifestyle  . Physical activity:    Days per week: Not on file    Minutes per session: Not on file  . Stress: Not on file  Relationships  . Social connections:    Talks on phone: Not on file    Gets together: Not on file    Attends religious service: Not on file    Active member of club or organization: Not on file    Attends meetings of clubs or organizations: Not on file    Relationship status: Not on file  . Intimate partner violence:    Fear of current or ex partner: Not on file    Emotionally abused: Not on file    Physically abused: Not on file    Forced sexual activity: Not on file  Other Topics Concern  . Not on file  Social History Narrative  . Not on file    The patient currently resides (home / rehab facility / nursing home): Home The patient normally is (ambulatory / bedbound): Ambulatory   FAMILY HISTORY:  History reviewed. No pertinent family history.   REVIEW OF SYSTEMS:  Constitutional: denies weight loss, fever, chills, or sweats  Eyes: denies any other vision changes, history of eye injury  ENT: denies sore throat, hearing problems  Respiratory: denies shortness of breath, wheezing  Cardiovascular: denies chest pain, palpitations  Gastrointestinal: positive for rectal pain, N/V Genitourinary: denies burning with urination or urinary frequency Musculoskeletal: denies any other joint pains or cramps  Skin: denies any other rashes or skin discolorations  Neurological: denies any other headache, dizziness, weakness  Psychiatric: denies any other depression, anxiety   All other review of systems were negative   VITAL SIGNS:  Temp:  [97.8 F (36.6 C)-98.6 F (37 C)] 97.8 F (36.6 C) (07/24 1446) Pulse Rate:  [89-112] 102 (07/24 1446) Resp:  [14-24] 18 (07/24 0338) BP: (120-162)/(58-70) 162/69 (07/24 1446) SpO2:  [90 %-98 %] 94 % (07/24 1446) Weight:  [42.2 kg (93 lb 1.6 oz)-43.1 kg (95 lb)] 42.2 kg (93 lb 1.6 oz) (07/24 0338)     Height: 5'  (152.4 cm) Weight: 42.2 kg (93 lb 1.6 oz) BMI (Calculated): 18.18   INTAKE/OUTPUT:  This shift: Total I/O In: -  Out: 650 [Urine:150; Emesis/NG output:500]  Last 2 shifts: @IOLAST2SHIFTS @   PHYSICAL EXAM:  Constitutional:  -- Cachectic  -- Awake, alert, and oriented x3  Eyes:  -- Pupils equally round and reactive to light  -- No scleral icterus  Ear, nose, and throat:  -- No jugular venous distension  Pulmonary:  -- No crackles  -- Equal decreased breath sounds bilaterally -- Breathing mildly-labored at rest Cardiovascular:  -- S1, S2 present  -- No pericardial rubs Gastrointestinal:  -- Abdomen soft, nontender, non-distended, no guarding or rebound tenderness -- No abdominal masses appreciated, pulsatile or otherwise  -- left lower quadrant  colostomy, with pink mucosa, old stool on bag. No blood identified on bag.  Musculoskeletal and Integumentary:  -- Wounds or skin discoloration: None appreciated -- Extremities: B/L UE and LE FROM, hands and feet warm, no edema  Neurologic:  -- Motor function: intact and symmetric -- Sensation: intact and symmetric   Labs:  CBC Latest Ref Rng & Units 02/02/2018  WBC 3.6 - 11.0 K/uL 18.3(H)  Hemoglobin 12.0 - 16.0 g/dL 11.3(L)  Hematocrit 35.0 - 47.0 % 34.6(L)  Platelets 150 - 440 K/uL 290   CMP Latest Ref Rng & Units 02/02/2018  Glucose 70 - 99 mg/dL 127(H)  BUN 8 - 23 mg/dL 16  Creatinine 0.44 - 1.00 mg/dL 0.60  Sodium 135 - 145 mmol/L 140  Potassium 3.5 - 5.1 mmol/L 3.1(L)  Chloride 98 - 111 mmol/L 92(L)  CO2 22 - 32 mmol/L 40(H)  Calcium 8.9 - 10.3 mg/dL 7.7(L)  Total Protein 6.5 - 8.1 g/dL 6.6  Total Bilirubin 0.3 - 1.2 mg/dL 0.6  Alkaline Phos 38 - 126 U/L 98  AST 15 - 41 U/L 25  ALT 0 - 44 U/L 31   Imaging studies:  EXAM: CT ABDOMEN AND PELVIS WITH CONTRAST  TECHNIQUE: Multidetector CT imaging of the abdomen and pelvis was performed using the standard protocol following bolus administration of intravenous  contrast.  CONTRAST:  52mL OMNIPAQUE IOHEXOL 300 MG/ML  SOLN  COMPARISON:  None.  FINDINGS: Lower chest: Emphysematous changes and fibrosis in the lungs. Nodular peribronchial infiltrates bilaterally but more prominent on the right. This likely indicates bronchopneumonia. Small esophageal hiatal hernia.  Hepatobiliary: Moderate bile duct dilatation with prominent pneumobilia, likely postoperative. Surgical absence of the gallbladder. No focal liver lesions identified. No portal venous gas.  Pancreas: Scattered pancreatic calcifications likely indicating chronic pancreatitis. No acute inflammatory infiltration is identified. No pancreatic ductal dilatation.  Spleen: Normal in size without focal abnormality.  Adrenals/Urinary Tract: Adrenal glands are unremarkable. Kidneys are normal, without renal calculi, focal lesion, or hydronephrosis. Bladder is decompressed.  Stomach/Bowel: Partial colonic resection with descending colostomy in the left lower quadrant. Remaining colon is stool filled with mild distention. Multiple dilated small bowel loops with multiple air-fluid levels and diffuse small bowel wall thickening. No pneumatosis. Changes likely to indicate small bowel obstruction with enteritis. Transition zone is not identified. Flow is demonstrated in the mesenteric arteries and veins. Bowel ischemia less likely.  Vascular/Lymphatic: Extensive aortic and branch vessel calcifications. Probably at least moderate stenosis of the iliac arteries bilaterally. No aneurysm. No significant lymphadenopathy.  Reproductive: Status post hysterectomy. No adnexal masses.  Other: Small amount of free fluid in the pelvis is likely reactive. No free air.  Musculoskeletal: Degenerative changes in the spine. Diffuse bone demineralization. Multiple vertebral compression fractures with kyphoplasty at L3 and L5. Changes likely represent osteoporosis.  IMPRESSION: 1. Multiple  dilated small bowel loops with multiple air-fluid levels and diffuse small bowel wall thickening. Changes likely to indicate small bowel obstruction with enteritis. Transition zone is not identified. 2. Small amount of free fluid in the pelvis is likely reactive. 3. Emphysematous changes and fibrosis in the lungs. Nodular peribronchial infiltrates in the lung bases, more prominent on the right, likely bronchopneumonia. 4. Extensive aortic and branch vessel calcifications. 5. Partial colonic resection with descending colostomy in the left lower quadrant. 6. Prominent pneumobilia, likely postoperative. 7. Diffuse bone demineralization with multiple vertebral compression fractures likely indicating osteoporosis.  Emphysema (ICD10-J43.9).   Electronically Signed   By: Lucienne Capers M.D.   On:  02/03/2018 00:11  Assessment/Plan: 84 y.o. female with small bowel obstruction, complicated by pertinent comorbidities including unresectable advanced rectal cancer, chronic COPD, severe malnourishment, and dehydration. This is a very complex case due to the age of patient with the current comorbidities and unresectable cancer stage with poor prognosis. CT scan show the dilated small bowel with no transition point identified. I personally reviewed the images. The cause of the bowel obstruction may be due to post operative adhesions, radiation scaring, radiation enteritis, among others. Radiated bowel is very friable and with patient malnutrition, injury to bowel during surgery may complicate even more the case with fistulas, abscess, infection and death. What is concerning is the finding of pneumobilia which most of the time is idiopathic but sometimes could be a sign of life threatening condition. Patient with leukocytosis which may be explained by the dehydration. Significant electrolytes imbalances. I discussed the the findings with the patient and the treatment alternatives, and I recommend to try  conservative management since the patient has no abdominal pain whatsoever, no peritonitis, no distention (even with the dilated small bowel seen in the CT scan), no guarding, no rebound tenderness. I oriented the patient to discuss with the granddaughter that is the only family member with her here in [place] (was not present in the room during my evaluation) that if she does not respond to medical therapy or if she gets worse with indication for surgery, if she would like to proceed with surgical management. Patient refers will though about it. I agree with NGT, NPO, IVF's and antibiotic therapy. After adequate decompression of gi tract with NGT, gastrograffin challenge may be considered. Will follow closely with abdominal exam.    [name], MD

## 2018-02-03 NOTE — ED Notes (Signed)
Patient transported to 113

## 2018-02-03 NOTE — ED Provider Notes (Signed)
-----------------------------------------   1:42 AM on 02/03/2018 -----------------------------------------   Blood pressure 138/64, pulse (!) 105, temperature 98.6 F (37 C), temperature source Oral, resp. rate 14, height 5\' 3"  (1.6 m), weight 43.1 kg (95 lb), SpO2 98 %.  Assuming care from Dr. Alfred Levins.  In short, Erin Horn is a 84 y.o. female with a chief complaint of Emesis .  Refer to the original H&P for additional details.  The current plan of care is to follow up the result of the CT scan and determine the patient's disposition.  CT abdomen and pelvis: Multiple dilated small bowel loops with multiple air-fluid levels and diffuse small bowel wall thickening, changes likely to indicate small bowel obstruction with enteritis.  Transition zone is not identified.  Small amount of free fluid in the pelvis is likely reactive.  Emphysematous changes and fibrosis in the lungs, nodular peribronchial infiltrates in the lung bases, more prominent on the right likely bronchopneumonia.  Extensive aortic and branch vessel calcifications.  Partial colonic resection with descending colostomy in the left lower quadrant, prominent pneumobilia likely postoperative.  Diffuse bone demineralization with multiple vertebral compression fractures.  As the patient is no longer a surgical candidate due to to her malignancy we will admit the patient to the hospitalist service.  We will place an NG tube and have her admitted for fluid.  I did contact the hospitalist and he did accept the patient to their service.        Loney Hering, MD 02/03/18 [phone]

## 2018-02-03 NOTE — Care Management Note (Signed)
Case Management Note  Patient Details  Name: Erin Horn MRN: [id number] Date of Birth: 1929/04/11  Subjective/Objective:    Admitted to [facility] with the diagnosis of small bowel obstruction. Lives with family. Moved from [place] to [place] 3 weeks ago. Granddaughter is Melissa [phone]). History of rectal cancer. Ostomy in place. Radiation and chemotherapy in the past. Chronic 2 liters oxygen 2 liters. NPO. Surgery and Palliative consults. WBC 18.3 Hgb 11.3                 Action/Plan: Followed by [facility] in the home.    Expected Discharge Date:                  Expected Discharge Plan:     In-House Referral:   yes  Discharge planning Services     Post Acute Care Choice:    Choice offered to:     DME Arranged:    DME Agency:     HH Arranged:    HH Agency:     Status of Service:     If discussed at [employer], dates discussed:    Additional Comments:  Shelbie Ammons, RN MSN CCM Care Management [phone] 02/03/2018, 8:52 AM

## 2018-02-03 NOTE — Progress Notes (Signed)
Discussed low bed with patient and patient refused to change beds/order a low bed. She was educated and agreed to call for assistance before getting out of bed and bed alarm is on.

## 2018-02-03 NOTE — Progress Notes (Signed)
Patient c/o burning with potassium running at 158ml/hr. Reduced rate to 69ml/hr and patient is not experiencing burning. Contacted pharmacy to request rescheduling of ordered potassium.

## 2018-02-03 NOTE — Progress Notes (Signed)
Initial Nutrition Assessment  DOCUMENTATION CODES:   Severe malnutrition in context of chronic illness, Underweight  INTERVENTION:   Noted plan for conservative management. RD to monitor for diet advancement vs TPN intiation.  - If diet advanced, recommend Ensure Enlive po BID, each supplement provides 350 kcal and 20 grams of protein  - Recommend Ocuvite daily  Monitor magnesium, potassium, and phosphorus daily for at least 3 days, MD to replete as needed, as pt is at risk for refeeding syndrome given chronic severe protein-calorie malnutrition. Magnesium and potassium currently low.  NUTRITION DIAGNOSIS:   Severe Malnutrition related to chronic illness, cancer and cancer related treatments (rectal cancer on hospice, COPD) as evidenced by moderate fat depletion, severe fat depletion, moderate muscle depletion, severe muscle depletion.  GOAL:   Patient will meet greater than or equal to 90% of their needs  MONITOR:   Diet advancement, Labs, I & O's, Skin, Weight trends  REASON FOR ASSESSMENT:   Malnutrition Screening Tool    ASSESSMENT:   84 year old female who presented to the ED with emesis and abdominal pain. PMH significant for rectal cancer on hospice and COPD. CT of abdomen and pelvis indicative of SBO with enteritis.   7/24 - NG tube placed  Discussed pt with RD.  Pt with NG tube to low intermittent suction. RD noted 650 ml of greenish-brown output in cannister at time of visit.  Spoke with pt at bedside who reports that she is "eating okay" and does not have a decreased appetite. Pt states that she typically has "one good meal" daily and snacks throughout the day on "sandwiches, soup, and cereal." Pt states that her main meal is made up of meat, salad, vegetables, and a carb like rice or a roll. Pt denies drinking oral nutrition supplements at home but is willing to try one during admission pending diet advancement.  Pt endorses steady weight loss over "a long  time." Pt reports her UBW as 130 but is unsure of when she last weighed this. Pt endorses difficulty swallowing at the present moment due to soreness in her throat.  Pt reports that the day PTA, she had a grilled ham and cheese sandwich for breakfast and a "potato sandwich" for lunch. Pt has not had any food since yesterday afternoon.  Medications reviewed and include: Lactated ringers @ 50 ml/hr, IV KCl x 6 runs today (stopped this morning)  Labs reviewed: magnesium 1.1 (L), potassium 3.1 (L), chloride 92 (L), CO2 40 (H), calcium 7.7 (L)  NG tube output: 850 ml since admission  NUTRITION - FOCUSED PHYSICAL EXAM:    Most Recent Value  Orbital Region  Moderate depletion  Upper Arm Region  Severe depletion  Thoracic and Lumbar Region  Severe depletion  Buccal Region  Moderate depletion  Temple Region  Moderate depletion  Clavicle Bone Region  Severe depletion  Clavicle and Acromion Bone Region  Severe depletion  Scapular Bone Region  Unable to assess  Dorsal Hand  Moderate depletion  Patellar Region  Moderate depletion  Anterior Thigh Region  Moderate depletion  Posterior Calf Region  Severe depletion  Edema (RD Assessment)  Mild [BLE]  Hair  Reviewed  Eyes  Reviewed  Mouth  Reviewed  Skin  Reviewed  Nails  Reviewed       Diet Order:   Diet Order           Diet NPO time specified  Diet effective now          EDUCATION NEEDS:  No education needs have been identified at this time  Skin:  Skin Assessment: Skin Integrity Issues: Stage I to R heel, L heel, sacrum  Last BM:  02/03/18 pt with colostomy  Height:   Ht Readings from Last 1 Encounters:  02/03/18 5' (1.524 m)    Weight:   Wt Readings from Last 1 Encounters:  02/03/18 93 lb 1.6 oz (42.2 kg)    Ideal Body Weight:  45.45 kg  BMI:  Body mass index is 18.18 kg/m.  Estimated Nutritional Needs:   Kcal:  1500-1700 kcal/day  Protein:  65-80 grams/day  Fluid:  1.5-1.7 L/day    Gaynell Face, MS, RD, LDN Pager: [phone] Weekend/After Hours: [phone]

## 2018-02-03 NOTE — Progress Notes (Signed)
Patient requested "breathing treatment". Contacted Bob, RT per her request.

## 2018-02-03 NOTE — Progress Notes (Signed)
Visit made. Patient is currently followed by [facility] at home with a diagnosis of rectal cancer. She is a DNR code with an out of facility DNR in place in the home. DNR and transfer summary in place in hospital chart. Patient was sent to the [facility] ED for evaluation of abdominal pain, vomiting and no output in her colostomy x 1 day. In the ED an abdominal CT revealed "Multiple dilated small bowel loops with multiple air-fluid levels and diffuse small bowel wall thickening. Changes likely to indicate small bowel obstruction with enteritis". Surgery has been consulted. Patient seen sitting up on the side of the bed, alert and oriented. Nasogastric tube in place in left nares draining dark green liquid. Patient reports some pain to her rectum. Staff RN Reatha Armour and aide janice present during visit. Patient up to the bedside commode at end of visit. Will continue to follow and update hospice team. Hospital care team aware of hospice involvement. Flo Shanks RN, BSN, [facility] [place], hospital liaison [phone]

## 2018-02-03 NOTE — ED Notes (Signed)
Report  Called to [facility] rn floor nurse

## 2018-02-03 NOTE — Progress Notes (Signed)
Pharmacy Antibiotic Note  Zayana Salvador is a 84 y.o. female admitted on 02/02/2018 with aspiration pneumonia.  Pharmacy has been consulted for Unasyn dosing.  Plan: Will start Unasyn 1.5g IV q8h  Height: 5' (152.4 cm) Weight: 93 lb 1.6 oz (42.2 kg) IBW/kg (Calculated) : 45.5  Temp (24hrs), Avg:98.2 F (36.8 C), Min:97.8 F (36.6 C), Max:98.6 F (37 C)  Recent Labs  Lab 02/02/18 2245 02/02/18 2246  WBC 18.3*  --   CREATININE 0.60  --   LATICACIDVEN  --  0.8    Estimated Creatinine Clearance: 33.6 mL/min (by C-G formula based on SCr of 0.6 mg/dL).    No Known Allergies  Thank you for allowing pharmacy to be a part of this patient's care.  Tobie Lords, PharmD, BCPS Clinical Pharmacist 02/03/2018

## 2018-02-03 NOTE — Plan of Care (Signed)
  Problem: Education: Goal: Knowledge of General Education information will improve Description Including pain rating scale, medication(s)/side effects and non-pharmacologic comfort measures Outcome: Progressing   Problem: Pain Managment: Goal: General experience of comfort will improve Outcome: Progressing   Problem: Safety: Goal: Ability to remain free from injury will improve Outcome: Progressing   Problem: Skin Integrity: Goal: Risk for impaired skin integrity will decrease Outcome: Progressing   Problem: Bowel/Gastric: Goal: Will not experience complications related to bowel motility Outcome: Progressing

## 2018-02-03 NOTE — ED Notes (Signed)
Pt sleeping   family with pt   

## 2018-02-04 ENCOUNTER — Inpatient Hospital Stay

## 2018-02-04 LAB — CBC WITH DIFFERENTIAL/PLATELET
BASOS PCT: 0 %
Basophils Absolute: 0 10*3/uL (ref 0–0.1)
EOS ABS: 0.1 10*3/uL (ref 0–0.7)
Eosinophils Relative: 1 %
HEMATOCRIT: 32 % — AB (ref 35.0–47.0)
Hemoglobin: 10.3 g/dL — ABNORMAL LOW (ref 12.0–16.0)
Lymphocytes Relative: 6 %
Lymphs Abs: 0.7 10*3/uL — ABNORMAL LOW (ref 1.0–3.6)
MCH: 26.3 pg (ref 26.0–34.0)
MCHC: 32.1 g/dL (ref 32.0–36.0)
MCV: 82 fL (ref 80.0–100.0)
MONOS PCT: 6 %
Monocytes Absolute: 0.7 10*3/uL (ref 0.2–0.9)
NEUTROS ABS: 10.3 10*3/uL — AB (ref 1.4–6.5)
NEUTROS PCT: 87 %
Platelets: 258 10*3/uL (ref 150–440)
RBC: 3.91 MIL/uL (ref 3.80–5.20)
RDW: 14.8 % — ABNORMAL HIGH (ref 11.5–14.5)
WBC: 11.8 10*3/uL — AB (ref 3.6–11.0)

## 2018-02-04 LAB — BASIC METABOLIC PANEL
ANION GAP: 9 (ref 5–15)
BUN: 13 mg/dL (ref 8–23)
CO2: 38 mmol/L — ABNORMAL HIGH (ref 22–32)
Calcium: 7.7 mg/dL — ABNORMAL LOW (ref 8.9–10.3)
Chloride: 94 mmol/L — ABNORMAL LOW (ref 98–111)
Creatinine, Ser: 0.47 mg/dL (ref 0.44–1.00)
GLUCOSE: 82 mg/dL (ref 70–99)
POTASSIUM: 3.4 mmol/L — AB (ref 3.5–5.1)
Sodium: 141 mmol/L (ref 135–145)

## 2018-02-04 LAB — MAGNESIUM: Magnesium: 2.2 mg/dL (ref 1.7–2.4)

## 2018-02-04 LAB — CALCIUM, IONIZED: CALCIUM, IONIZED, SERUM: 4.2 mg/dL — AB (ref 4.5–5.6)

## 2018-02-04 MED ORDER — PHENOL 1.4 % MT LIQD
1.0000 | OROMUCOSAL | Status: DC | PRN
  Administered 2018-02-04 – 2018-02-05 (×2): 1 via OROMUCOSAL
  Filled 2018-02-04: qty 177

## 2018-02-04 MED ORDER — POTASSIUM CHLORIDE 2 MEQ/ML IV SOLN
INTRAVENOUS | Status: DC
  Administered 2018-02-04 – 2018-02-05 (×2): via INTRAVENOUS
  Filled 2018-02-04 (×3): qty 1000

## 2018-02-04 NOTE — Progress Notes (Signed)
Pharmacy Electrolyte Monitoring Consult:  Pharmacy consulted to assist in monitoring and replacing electrolytes in this 84 y.o. female admitted on 02/02/2018 with Emesis   Labs:  Sodium (mmol/L)  Date Value  02/04/2018 141   Potassium (mmol/L)  Date Value  02/04/2018 3.4 (L)   Magnesium (mg/dL)  Date Value  02/04/2018 2.2   Calcium (mg/dL)  Date Value  02/04/2018 7.7 (L)   Albumin (g/dL)  Date Value  02/02/2018 3.3 (L)    Assessment/Plan: Will replace K+ via fluids Will start LR + KCI 40 mEq @ 50 ml/hr and will recheck BMP w/ am labs.  Tobie Lords, PharmD, BCPS Clinical Pharmacist 02/04/2018

## 2018-02-04 NOTE — Progress Notes (Signed)
Patient ID: Erin Horn, female   DOB: Nov 30, 1928, 84 y.o.   MRN: [id number]     [place] Hospital Day(s): 1.   Post op day(s):  Marland Kitchen   Interval History: Patient seen and examined today and found resting comfortable in bed, no acute events or new complaints overnight. Patient reports improved pain, denies nausea or vomiting.  Vital signs in last 24 hours: [min-max] current  Temp:  [97.7 F (36.5 C)-98 F (36.7 C)] 97.7 F (36.5 C) (07/25 0432) Pulse Rate:  [90-102] 91 (07/25 0432) Resp:  [22] 22 (07/24 1926) BP: (142-162)/(57-69) 142/62 (07/25 0432) SpO2:  [94 %-98 %] 98 % (07/25 0733)     Height: 5' (152.4 cm) Weight: 42.2 kg (93 lb 1.6 oz) BMI (Calculated): 18.18   Physical Exam:  Constitutional: alert, cooperative and no distress  Respiratory: breathing non-labored at rest  Cardiovascular: regular rate and sinus rhythm  Gastrointestinal: soft, non-tender, and non-distended. Ostomy pink with significant stool on bag.   Labs:  CBC Latest Ref Rng & Units 02/04/2018 02/02/2018  WBC 3.6 - 11.0 K/uL 11.8(H) 18.3(H)  Hemoglobin 12.0 - 16.0 g/dL 10.3(L) 11.3(L)  Hematocrit 35.0 - 47.0 % 32.0(L) 34.6(L)  Platelets 150 - 440 K/uL 258 290   CMP Latest Ref Rng & Units 02/04/2018 02/02/2018  Glucose 70 - 99 mg/dL 82 127(H)  BUN 8 - 23 mg/dL 13 16  Creatinine 0.44 - 1.00 mg/dL 0.47 0.60  Sodium 135 - 145 mmol/L 141 140  Potassium 3.5 - 5.1 mmol/L 3.4(L) 3.1(L)  Chloride 98 - 111 mmol/L 94(L) 92(L)  CO2 22 - 32 mmol/L 38(H) 40(H)  Calcium 8.9 - 10.3 mg/dL 7.7(L) 7.7(L)  Total Protein 6.5 - 8.1 g/dL - 6.6  Total Bilirubin 0.3 - 1.2 mg/dL - 0.6  Alkaline Phos 38 - 126 U/L - 98  AST 15 - 41 U/L - 25  ALT 0 - 44 U/L - 31   Imaging studies: Abdominal xray today show no bowel dilation. No free air. Personally reviewed.   Assessment/Plan:  84 y.o. female with small bowel obstruction, complicated by pertinent comorbidities including unresectable advanced rectal cancer, chronic  COPD, severe malnourishment, and dehydration.   Patient with better appearance today. WBC decreasing significantly. There is stool on the ostomy bag. Will clam tube and assess for toleration. New xray show no bowel dilation. Will try to continue conservative management.   [name], MD

## 2018-02-04 NOTE — Progress Notes (Signed)
[facility] [place] NAME: Erin Horn    MR#:  [id number]  DATE OF BIRTH:  Oct 10, 1928  SUBJECTIVE:  CHIEF COMPLAINT:   Chief Complaint  Patient presents with  . Emesis   History of rectal cancer status post surgery.  Has colostomy bag.  Admitted for small bowel obstruction. No abdominal pain.  Decreased NG output.  NG tube clamped earlier according to surgeon's recommendations.  REVIEW OF SYSTEMS:  CONSTITUTIONAL: No fever,have fatigue or weakness.  EYES: No blurred or double vision.  EARS, NOSE, AND THROAT: No tinnitus or ear pain.  RESPIRATORY: No cough, shortness of breath, wheezing or hemoptysis.  CARDIOVASCULAR: No chest pain, orthopnea, edema.  GASTROINTESTINAL: No nausea, vomiting, diarrhea , have abdominal pain.  GENITOURINARY: No dysuria, hematuria.  ENDOCRINE: No polyuria, nocturia,  HEMATOLOGY: No anemia, easy bruising or bleeding SKIN: No rash or lesion. MUSCULOSKELETAL: No joint pain or arthritis.   NEUROLOGIC: No tingling, numbness, weakness.  PSYCHIATRY: No anxiety or depression.   ROS  DRUG ALLERGIES:  No Known Allergies  VITALS:  Blood pressure (!) 142/62, pulse 91, temperature 97.7 F (36.5 C), temperature source Oral, resp. rate (!) 22, height 5' (1.524 m), weight 42.2 kg (93 lb 1.6 oz), SpO2 98 %.  PHYSICAL EXAMINATION:  GENERAL:  84 y.o.-year-old patient lying in the bed with no acute distress.  EYES: Pupils equal, round, reactive to light and accommodation. No scleral icterus. Extraocular muscles intact.  HEENT: Head atraumatic, normocephalic. Oropharynx and nasopharynx clear.  NG tube in place NECK:  Supple, no jugular venous distention. No thyroid enlargement, no tenderness.  LUNGS: Normal breath sounds bilaterally, no wheezing, b/l crepitation. No use of accessory muscles of respiration. Have nasal canula oxygen. CARDIOVASCULAR: S1, S2 normal. No murmurs, rubs, or gallops.  ABDOMEN: Soft, nontender,  nondistended. Bowel sounds present. No organomegaly or mass. Colostomy in place. EXTREMITIES: No pedal edema, cyanosis, or clubbing.  NEUROLOGIC: Cranial nerves II through XII are intact. Muscle strength 4/5 in all extremities. Sensation intact. Gait not checked.  PSYCHIATRIC: The patient is alert and oriented x 3.  SKIN: No obvious rash, lesion, or ulcer.   Physical Exam LABORATORY PANEL:   CBC Recent Labs  Lab 02/04/18 0518  WBC 11.8*  HGB 10.3*  HCT 32.0*  PLT 258   ------------------------------------------------------------------------------------------------------------------  Chemistries  Recent Labs  Lab 02/02/18 2245  02/04/18 0518  NA 140  --  141  K 3.1*  --  3.4*  CL 92*  --  94*  CO2 40*  --  38*  GLUCOSE 127*  --  82  BUN 16  --  13  CREATININE 0.60  --  0.47  CALCIUM 7.7*  --  7.7*  MG  --    < > 2.2  AST 25  --   --   ALT 31  --   --   ALKPHOS 98  --   --   BILITOT 0.6  --   --    < > = values in this interval not displayed.   ------------------------------------------------------------------------------------------------------------------  Cardiac Enzymes No results for input(s): TROPONINI in the last 168 hours. ------------------------------------------------------------------------------------------------------------------  RADIOLOGY:  Dg Abdomen 1 View  Result Date: 02/03/2018 CLINICAL DATA:  NG tube placement EXAM: ABDOMEN - 1 VIEW COMPARISON:  None. FINDINGS: The enteric tube is present with tip over the left upper quadrant consistent with location in the body of the stomach. Cardiac enlargement. Emphysematous changes in the lungs. No blunting of costophrenic angles.  No visible pneumothorax. Mediastinal contours appear intact. Residual contrast material in the renal collecting systems. IMPRESSION: Enteric tube tip is in the left upper quadrant consistent with location in the body of the stomach. Electronically Signed   By: Lucienne Capers M.D.    On: 02/03/2018 03:14   Ct Abdomen Pelvis W Contrast  Result Date: 02/03/2018 CLINICAL DATA:  Vomiting and mid abdominal pain today. Colostomy due to rectal cancer. Decreased stool in the bag. EXAM: CT ABDOMEN AND PELVIS WITH CONTRAST TECHNIQUE: Multidetector CT imaging of the abdomen and pelvis was performed using the standard protocol following bolus administration of intravenous contrast. CONTRAST:  25mL OMNIPAQUE IOHEXOL 300 MG/ML  SOLN COMPARISON:  None. FINDINGS: Lower chest: Emphysematous changes and fibrosis in the lungs. Nodular peribronchial infiltrates bilaterally but more prominent on the right. This likely indicates bronchopneumonia. Small esophageal hiatal hernia. Hepatobiliary: Moderate bile duct dilatation with prominent pneumobilia, likely postoperative. Surgical absence of the gallbladder. No focal liver lesions identified. No portal venous gas. Pancreas: Scattered pancreatic calcifications likely indicating chronic pancreatitis. No acute inflammatory infiltration is identified. No pancreatic ductal dilatation. Spleen: Normal in size without focal abnormality. Adrenals/Urinary Tract: Adrenal glands are unremarkable. Kidneys are normal, without renal calculi, focal lesion, or hydronephrosis. Bladder is decompressed. Stomach/Bowel: Partial colonic resection with descending colostomy in the left lower quadrant. Remaining colon is stool filled with mild distention. Multiple dilated small bowel loops with multiple air-fluid levels and diffuse small bowel wall thickening. No pneumatosis. Changes likely to indicate small bowel obstruction with enteritis. Transition zone is not identified. Flow is demonstrated in the mesenteric arteries and veins. Bowel ischemia less likely. Vascular/Lymphatic: Extensive aortic and branch vessel calcifications. Probably at least moderate stenosis of the iliac arteries bilaterally. No aneurysm. No significant lymphadenopathy. Reproductive: Status post hysterectomy. No  adnexal masses. Other: Small amount of free fluid in the pelvis is likely reactive. No free air. Musculoskeletal: Degenerative changes in the spine. Diffuse bone demineralization. Multiple vertebral compression fractures with kyphoplasty at L3 and L5. Changes likely represent osteoporosis. IMPRESSION: 1. Multiple dilated small bowel loops with multiple air-fluid levels and diffuse small bowel wall thickening. Changes likely to indicate small bowel obstruction with enteritis. Transition zone is not identified. 2. Small amount of free fluid in the pelvis is likely reactive. 3. Emphysematous changes and fibrosis in the lungs. Nodular peribronchial infiltrates in the lung bases, more prominent on the right, likely bronchopneumonia. 4. Extensive aortic and branch vessel calcifications. 5. Partial colonic resection with descending colostomy in the left lower quadrant. 6. Prominent pneumobilia, likely postoperative. 7. Diffuse bone demineralization with multiple vertebral compression fractures likely indicating osteoporosis. Emphysema (ICD10-J43.9). Electronically Signed   By: Lucienne Capers M.D.   On: 02/03/2018 00:11   Dg Abd 2 Views  Result Date: 02/04/2018 CLINICAL DATA:  Abdominal pain.  Nausea vomiting. EXAM: ABDOMEN - 2 VIEW COMPARISON:  Chest x-ray 02/03/2018.  CT 02/02/2018. FINDINGS: NG tube noted with its tip below the left hemidiaphragm. Side-hole noted at the gastroesophageal junction. Advancement of the NG tube approximately 8 cm should be considered. Cardiomegaly. Small bilateral pleural effusions. Rounded density noted over the left mid abdomen most likely represents of fluid-filled bowel loop. Reference is made to prior CT report 02/02/2018. No free air. Air in the biliary system again noted. Aortoiliac atherosclerotic vascular calcification. Thoracic/lumbar spine scoliosis and degenerative change. Prior lumbar vertebroplasties. IMPRESSION: NG tube noted with its tip below left hemidiaphragm. Side  hole is at the gastroesophageal junction. Advancement of the NG tube approximately 8 cm  should be considered. Electronically Signed   By: Marcello Moores  Register   On: 02/04/2018 09:17    ASSESSMENT AND PLAN:   Active Problems:   Small bowel obstruction (HCC)   Pressure injury of skin   Protein-calorie malnutrition, severe  *Small bowel obstruction NG tube clamped Appreciate surgery input  *Hypokalemia Secondary to GI fluid loss due to obstruction. Replace IV and oral, magnesium low - replaced  * Enteritis On IV ciprofloxacin and Flagyl  *Possible bronchopneumonia There may be a component of aspiration also. Cipro and Flagyl on board   All the records are reviewed and case discussed with Care Management/Social Workerr. Management plans discussed with the patient, family and they are in agreement.  CODE STATUS: DNR  TOTAL TIME TAKING CARE OF THIS PATIENT: 35 minutes.   POSSIBLE D/C IN 1-2 DAYS, DEPENDING ON CLINICAL CONDITION.  Neita Carp M.D on 02/04/2018   Between 7am to 6pm - Pager - [phone]  After 6pm go to [email or web address] - password [email or web address] [facility]  Office  [phone]  CC: Primary care physician; Patient, No Pcp Per  Note: This dictation was prepared with Dragon dictation along with smaller phrase technology. Any transcriptional errors that result from this process are unintentional.

## 2018-02-04 NOTE — Progress Notes (Signed)
Visit made. Patient seen lying in bed, just back in bed from the bedside commode. She appeared dyspneic and reported pain in her rectum. Staff RN Reatha Armour made aware and PRN dilaudid given. Per chart note review patient has had some stool out put in her colostomy. Surgery note reports improvement in obstruction with no bowel dilation noted. NG tube remains in place and she continues on IV antibiotics. Will continue to follow and update hospice team. Flo Shanks RN, BSN, [facility] [place], hospital Liaison [phone]

## 2018-02-05 LAB — CBC WITH DIFFERENTIAL/PLATELET
BASOS PCT: 0 %
Basophils Absolute: 0 10*3/uL (ref 0–0.1)
EOS ABS: 0.1 10*3/uL (ref 0–0.7)
Eosinophils Relative: 1 %
HCT: 31.2 % — ABNORMAL LOW (ref 35.0–47.0)
HEMOGLOBIN: 10.3 g/dL — AB (ref 12.0–16.0)
Lymphocytes Relative: 7 %
Lymphs Abs: 0.8 10*3/uL — ABNORMAL LOW (ref 1.0–3.6)
MCH: 27 pg (ref 26.0–34.0)
MCHC: 32.9 g/dL (ref 32.0–36.0)
MCV: 81.8 fL (ref 80.0–100.0)
Monocytes Absolute: 0.6 10*3/uL (ref 0.2–0.9)
Monocytes Relative: 5 %
NEUTROS PCT: 87 %
Neutro Abs: 9.1 10*3/uL — ABNORMAL HIGH (ref 1.4–6.5)
Platelets: 263 10*3/uL (ref 150–440)
RBC: 3.81 MIL/uL (ref 3.80–5.20)
RDW: 14.5 % (ref 11.5–14.5)
WBC: 10.6 10*3/uL (ref 3.6–11.0)

## 2018-02-05 LAB — BASIC METABOLIC PANEL
Anion gap: 8 (ref 5–15)
BUN: 11 mg/dL (ref 8–23)
CALCIUM: 7.9 mg/dL — AB (ref 8.9–10.3)
CO2: 34 mmol/L — ABNORMAL HIGH (ref 22–32)
CREATININE: 0.49 mg/dL (ref 0.44–1.00)
Chloride: 97 mmol/L — ABNORMAL LOW (ref 98–111)
Glucose, Bld: 84 mg/dL (ref 70–99)
Potassium: 3.9 mmol/L (ref 3.5–5.1)
SODIUM: 139 mmol/L (ref 135–145)

## 2018-02-05 LAB — PHOSPHORUS: Phosphorus: 2.8 mg/dL (ref 2.5–4.6)

## 2018-02-05 MED ORDER — ORAL CARE MOUTH RINSE
15.0000 mL | Freq: Two times a day (BID) | OROMUCOSAL | Status: DC
  Administered 2018-02-05 – 2018-02-06 (×3): 15 mL via OROMUCOSAL

## 2018-02-05 MED ORDER — MORPHINE SULFATE (CONCENTRATE) 10 MG/0.5ML PO SOLN: 10.0000 mg | ORAL | Status: DC | PRN

## 2018-02-05 MED ORDER — CIPROFLOXACIN HCL 500 MG PO TABS
500.0000 mg | ORAL_TABLET | Freq: Two times a day (BID) | ORAL | Status: DC
  Administered 2018-02-05 – 2018-02-06 (×2): 500 mg via ORAL
  Filled 2018-02-05 (×3): qty 1

## 2018-02-05 MED ORDER — METRONIDAZOLE 500 MG PO TABS
500.0000 mg | ORAL_TABLET | Freq: Three times a day (TID) | ORAL | Status: DC
  Administered 2018-02-05 – 2018-02-06 (×4): 500 mg via ORAL
  Filled 2018-02-05 (×4): qty 1

## 2018-02-05 MED ORDER — MORPHINE SULFATE ER 15 MG PO TBCR
15.0000 mg | EXTENDED_RELEASE_TABLET | Freq: Three times a day (TID) | ORAL | Status: DC
  Administered 2018-02-05 – 2018-02-06 (×4): 15 mg via ORAL
  Filled 2018-02-05 (×4): qty 1

## 2018-02-05 MED ORDER — CHLORHEXIDINE GLUCONATE 0.12 % MT SOLN
15.0000 mL | Freq: Two times a day (BID) | OROMUCOSAL | Status: DC
  Administered 2018-02-05 – 2018-02-06 (×3): 15 mL via OROMUCOSAL
  Filled 2018-02-05 (×3): qty 15

## 2018-02-05 NOTE — Progress Notes (Signed)
Patient ID: Erin Horn, female   DOB: January 05, 1929, 84 y.o.   MRN: [id number]     [place] Hospital Day(s): 2.   Post op day(s):  Marland Kitchen   Interval History: Patient seen and examined, no acute events or new complaints overnight. Patient reports continue to complain of rectal pian. This is due to the rectal unresectable cancer. Denies nausea of vomiting since the NGT was clamped.  Vital signs in last 24 hours: [min-max] current  Temp:  [98 F (36.7 C)-98.2 F (36.8 C)] 98.2 F (36.8 C) (07/26 0538) Pulse Rate:  [84-92] 84 (07/26 0538) Resp:  [17-22] 22 (07/26 0538) BP: (129-141)/(44-59) 141/59 (07/26 0538) SpO2:  [94 %-97 %] 97 % (07/26 0538)     Height: 5' (152.4 cm) Weight: 42.2 kg (93 lb 1.6 oz) BMI (Calculated): 18.18    Physical Exam:  Constitutional: alert, cooperative and no distress  Gastrointestinal: soft, non-tender, and non-distended. Ostomy with abundant fecal material.   Labs:  CBC Latest Ref Rng & Units 02/05/2018 02/04/2018 02/02/2018  WBC 3.6 - 11.0 K/uL 10.6 11.8(H) 18.3(H)  Hemoglobin 12.0 - 16.0 g/dL 10.3(L) 10.3(L) 11.3(L)  Hematocrit 35.0 - 47.0 % 31.2(L) 32.0(L) 34.6(L)  Platelets 150 - 440 K/uL 263 258 290   CMP Latest Ref Rng & Units 02/05/2018 02/04/2018 02/02/2018  Glucose 70 - 99 mg/dL 84 82 127(H)  BUN 8 - 23 mg/dL 11 13 16   Creatinine 0.44 - 1.00 mg/dL 0.49 0.47 0.60  Sodium 135 - 145 mmol/L 139 141 140  Potassium 3.5 - 5.1 mmol/L 3.9 3.4(L) 3.1(L)  Chloride 98 - 111 mmol/L 97(L) 94(L) 92(L)  CO2 22 - 32 mmol/L 34(H) 38(H) 40(H)  Calcium 8.9 - 10.3 mg/dL 7.9(L) 7.7(L) 7.7(L)  Total Protein 6.5 - 8.1 g/dL - - 6.6  Total Bilirubin 0.3 - 1.2 mg/dL - - 0.6  Alkaline Phos 38 - 126 U/L - - 98  AST 15 - 41 U/L - - 25  ALT 0 - 44 U/L - - 31   Imaging studies: Abdominal xray personally reviewed. No small bowel dilation.    Assessment/Plan:  84 y.o.femalewith small bowel obstruction, complicated by pertinent comorbidities includingunresectable  advanced rectal cancer, chronic COPD, severe malnourishment, and dehydration.   Patient with better appearance today. WBC decreasing are normal. There is stool on the ostomy bag. Patient tolerated clamped NGT without nausea. Will start clear liquid and assess for toleration. Will try to continue conservative management.   [name], MD

## 2018-02-05 NOTE — Progress Notes (Signed)
[facility] [place] NAME: Erin Horn    MR#:  [id number]  DATE OF BIRTH:  01-Mar-1929  SUBJECTIVE:  CHIEF COMPLAINT:   Chief Complaint  Patient presents with  . Emesis   History of rectal cancer status post surgery.  Has colostomy bag.  Admitted for small bowel obstruction. No abdominal pain.   Good colostomy output  NGT removed and tolerating liquids  REVIEW OF SYSTEMS:  CONSTITUTIONAL: No fever,have fatigue or weakness.  EYES: No blurred or double vision.  EARS, NOSE, AND THROAT: No tinnitus or ear pain.  RESPIRATORY: No cough, shortness of breath, wheezing or hemoptysis.  CARDIOVASCULAR: No chest pain, orthopnea, edema.  GASTROINTESTINAL: No nausea, vomiting, diarrhea , have abdominal pain.  GENITOURINARY: No dysuria, hematuria.  ENDOCRINE: No polyuria, nocturia,  HEMATOLOGY: No anemia, easy bruising or bleeding SKIN: No rash or lesion. MUSCULOSKELETAL: No joint pain or arthritis.   NEUROLOGIC: No tingling, numbness, weakness.  PSYCHIATRY: No anxiety or depression.   ROS  DRUG ALLERGIES:  No Known Allergies  VITALS:  Blood pressure (!) 141/59, pulse 84, temperature 98.2 F (36.8 C), temperature source Oral, resp. rate (!) 22, height 5' (1.524 m), weight 42.2 kg (93 lb 1.6 oz), SpO2 97 %.  PHYSICAL EXAMINATION:  GENERAL:  84 y.o.-year-old patient lying in the bed with no acute distress.  EYES: Pupils equal, round, reactive to light and accommodation. No scleral icterus. Extraocular muscles intact.  HEENT: Head atraumatic, normocephalic. Oropharynx and nasopharynx clear.  NECK:  Supple, no jugular venous distention. No thyroid enlargement, no tenderness.  LUNGS: Normal breath sounds bilaterally, no wheezing, b/l crepitation. No use of accessory muscles of respiration. Have nasal canula oxygen. CARDIOVASCULAR: S1, S2 normal. No murmurs, rubs, or gallops.  ABDOMEN: Soft, nontender, nondistended. Bowel sounds present. No  organomegaly or mass. Colostomy bag in place. EXTREMITIES: No pedal edema, cyanosis, or clubbing.  NEUROLOGIC: Cranial nerves II through XII are intact. Muscle strength 4/5 in all extremities. Sensation intact. Gait not checked.  PSYCHIATRIC: The patient is alert and oriented x 3.  SKIN: No obvious rash, lesion, or ulcer.   Physical Exam LABORATORY PANEL:   CBC Recent Labs  Lab 02/05/18 0518  WBC 10.6  HGB 10.3*  HCT 31.2*  PLT 263   ------------------------------------------------------------------------------------------------------------------  Chemistries  Recent Labs  Lab 02/02/18 2245  02/04/18 0518 02/05/18 0518  NA 140  --  141 139  K 3.1*  --  3.4* 3.9  CL 92*  --  94* 97*  CO2 40*  --  38* 34*  GLUCOSE 127*  --  82 84  BUN 16  --  13 11  CREATININE 0.60  --  0.47 0.49  CALCIUM 7.7*  --  7.7* 7.9*  MG  --    < > 2.2  --   AST 25  --   --   --   ALT 31  --   --   --   ALKPHOS 98  --   --   --   BILITOT 0.6  --   --   --    < > = values in this interval not displayed.   ------------------------------------------------------------------------------------------------------------------  Cardiac Enzymes No results for input(s): TROPONINI in the last 168 hours. ------------------------------------------------------------------------------------------------------------------  RADIOLOGY:  Dg Abd 2 Views  Result Date: 02/04/2018 CLINICAL DATA:  Abdominal pain.  Nausea vomiting. EXAM: ABDOMEN - 2 VIEW COMPARISON:  Chest x-ray 02/03/2018.  CT 02/02/2018. FINDINGS: NG tube noted with its tip  below the left hemidiaphragm. Side-hole noted at the gastroesophageal junction. Advancement of the NG tube approximately 8 cm should be considered. Cardiomegaly. Small bilateral pleural effusions. Rounded density noted over the left mid abdomen most likely represents of fluid-filled bowel loop. Reference is made to prior CT report 02/02/2018. No free air. Air in the biliary system  again noted. Aortoiliac atherosclerotic vascular calcification. Thoracic/lumbar spine scoliosis and degenerative change. Prior lumbar vertebroplasties. IMPRESSION: NG tube noted with its tip below left hemidiaphragm. Side hole is at the gastroesophageal junction. Advancement of the NG tube approximately 8 cm should be considered. Electronically Signed   By: Marcello Moores  Register   On: 02/04/2018 09:17    ASSESSMENT AND PLAN:   Active Problems:   Small bowel obstruction (HCC)   Pressure injury of skin   Protein-calorie malnutrition, severe  *Small bowel obstruction NG tube clamped Appreciate surgery input  *Hypokalemia Secondary to GI fluid loss due to obstruction. Replace IV and oral, magnesium low - replaced  * Enteritis On IV ciprofloxacin and Flagyl. Change to PO  *Possible bronchopneumonia There may be a component of aspiration also. Cipro and Flagyl on board   All the records are reviewed and case discussed with Care Management/Social Workerr. Management plans discussed with the patient, family and they are in agreement.  CODE STATUS: DNR  TOTAL TIME TAKING CARE OF THIS PATIENT: 35 minutes.   Likely d/c in AM  Leia Alf [name] M.D on 02/05/2018   Between 7am to 6pm - Pager - [phone]  After 6pm go to [email or web address] - password [email or web address] [facility]  Office  [phone]  CC: Primary care physician; Patient, No Pcp Per  Note: This dictation was prepared with Dragon dictation along with smaller phrase technology. Any transcriptional errors that result from this process are unintentional.

## 2018-02-05 NOTE — Progress Notes (Addendum)
Visit made. Patient seen siting on hte side of the bed, physical therapist present, pateint to attempt some exercises. She does ambulate short distances with a walker at home. NG tube removed, patient now on clear liquid diet. Home dose of MS contin 15 mg q 8 hrs restarted today. Patient uses liquid morphine at home for break through pain, attending physician Dr. Darvin Neighbours aware. Current plan is for discharge home tomorrow with continued hospice services. Writer spoke with patient's grand daughter Lenna Sciara who is a ware of the plan. Requested that she contact hospice when patient returns home tomorrow. Updated notes faxed to triage/medical records. Flo Shanks RN, BSN, [facility], hospital Liaison [phone]

## 2018-02-05 NOTE — Evaluation (Signed)
Physical Therapy Evaluation Patient Details Name: Erin Horn MRN: [id number] DOB: 1929/01/16 Today's Date: 02/05/2018   History of Present Illness  pt presents to hospital on 02/02/18 and subsequently diagnosed with a small bowel obstruction, emesis, and severe malnutrition. Pt has a past medical hostory that includes rectal cancer and chronic COPD. pt is currently in the care of [facility] [place] [facility]    Clinical Impression  Pt is a pleasant 84 year old female who was admitted for a small bowel obstruction. Pt performs bed mobility, transfers, and ambulation with CGA. Pt demonstrates deficits with strength and endurance. Pt is currently in the care of [place] [facility] for rectal cancer. Pt instructed in LE there-ex and encouraged to perform at home. Pt amb 40' with no rest breaks, CGA and RW. Pt on 2L throughout session via [place] and O2 saturation maintained WNL. Pt states that physically she feels at her baseline and states that actually she was less tired after amb than usual when in the home. PT is not recommending follow up PT at this time due to pt functioning at baseline. Pts mobility deficits are likely not physical in nature and are associated with rectal cancer. PT is recommending d/c home with continued in home hospice care.     Follow Up Recommendations No PT follow up    Equipment Recommendations  None recommended by PT    Recommendations for Other Services       Precautions / Restrictions Precautions Precautions: None Restrictions Weight Bearing Restrictions: No      Mobility  Bed Mobility Overal bed mobility: Independent             General bed mobility comments: pt performs supine to sit independently without difficulty  Transfers Overall transfer level: Needs assistance Equipment used: Rolling walker (2 wheeled) Transfers: Sit to/from Stand Sit to Stand: Min guard         General transfer comment: pt performs sit<>stand with CGA to RW safely no  gross LOB.  Ambulation/Gait Ambulation/Gait assistance: Min guard Gait Distance (Feet): 40 Feet Assistive device: Rolling walker (2 wheeled) Gait Pattern/deviations: Step-through pattern;Decreased step length - right;Decreased step length - left     General Gait Details: pt amb 40' total with CGA and RW within the room. Pt maneuvers enviornment safely without LOB. pt slightly fatigued at end of amb. Pts O2 monitiored and remained WNL throughout  Stairs            Wheelchair Mobility    Modified Rankin (Stroke Patients Only)       Balance Overall balance assessment: Needs assistance   Sitting balance-Leahy Scale: Good Sitting balance - Comments: able to sit EOB with B feet flat on floor and no UE support no gross LOB     Standing balance-Leahy Scale: Fair Standing balance comment: pt requires B UE support on RW however is able to maintain upright with no gross LOB                             Pertinent Vitals/Pain Pain Assessment: 0-10 Pain Score: 6  Pain Location: abdomen Pain Descriptors / Indicators: Constant Pain Intervention(s): Monitored during session;Repositioned    Home Living Family/patient expects to be discharged to:: Private residence Living Arrangements: Other relatives(granddaughter) Available Help at Discharge: Family;Other (Comment)(hospice) Type of Home: House Home Access: Level entry     Home Layout: One level Home Equipment: Walker - 4 wheels;Cane - single point  Prior Function Level of Independence: Needs assistance   Gait / Transfers Assistance Needed: pt performs short household amb with 4 wheel RW. needs assistance and frequent rest breaks if amb longer distances in the home           Hand Dominance        Extremity/Trunk Assessment   Upper Extremity Assessment Upper Extremity Assessment: Overall WFL for tasks assessed(Grossly at least 4-/5)    Lower Extremity Assessment Lower Extremity Assessment:  Overall WFL for tasks assessed(Grossly at least 4-/5 hip flex, knee flex/ext, DF)       Communication   Communication: No difficulties  Cognition Arousal/Alertness: Awake/alert Behavior During Therapy: WFL for tasks assessed/performed Overall Cognitive Status: Within Functional Limits for tasks assessed                                        General Comments      Exercises Other Exercises Other Exercises: pt instructed in and encouraged to perform ther-ex x10 reps bilaterally including LAQ, SLR, quad sets, and glut sets   Assessment/Plan    PT Assessment Patent does not need any further PT services  PT Problem List         PT Treatment Interventions      PT Goals (Current goals can be found in the Care Plan section)  Acute Rehab PT Goals Patient Stated Goal: to go home PT Goal Formulation: With patient Time For Goal Achievement: 02/19/18 Potential to Achieve Goals: Poor    Frequency     Barriers to discharge        Co-evaluation               AM-PAC PT "6 Clicks" Daily Activity  Outcome Measure Difficulty turning over in bed (including adjusting bedclothes, sheets and blankets)?: None Difficulty moving from lying on back to sitting on the side of the bed? : None Difficulty sitting down on and standing up from a chair with arms (e.g., wheelchair, bedside commode, etc,.)?: Unable Help needed moving to and from a bed to chair (including a wheelchair)?: None Help needed walking in hospital room?: None Help needed climbing 3-5 steps with a railing? : A Little 6 Click Score: 20    End of Session Equipment Utilized During Treatment: Gait belt Activity Tolerance: Patient tolerated treatment well Patient left: in chair;with call bell/phone within reach;with chair alarm set Nurse Communication: Mobility status PT Visit Diagnosis: Muscle weakness (generalized) (M62.81);Pain Pain - Right/Left: (both) Pain - part of body: (abdomen)    Time:  5732-2025 PT Time Calculation (min) (ACUTE ONLY): 24 min   Charges:              Yolonda Kida, SPT   Samanyu Tinnell 02/05/2018, 2:58 PM

## 2018-02-05 NOTE — Progress Notes (Signed)
Pharmacy Electrolyte Monitoring Consult:  Pharmacy consulted to assist in monitoring and replacing electrolytes in this 84 y.o. female admitted on 02/02/2018 with Emesis   Labs:  Sodium (mmol/L)  Date Value  02/05/2018 139   Potassium (mmol/L)  Date Value  02/05/2018 3.9   Magnesium (mg/dL)  Date Value  02/04/2018 2.2   Calcium (mg/dL)  Date Value  02/05/2018 7.9 (L)   Albumin (g/dL)  Date Value  02/02/2018 3.3 (L)    Assessment/Plan: K  3.9 Patient on LR + KCI 40 mEq @ 50 ml/h. will recheck BMP/mag w/ am labs.  Erin Horn PharmD Clinical Pharmacist 02/05/2018

## 2018-02-06 LAB — BASIC METABOLIC PANEL
Anion gap: 3 — ABNORMAL LOW (ref 5–15)
BUN: 6 mg/dL — ABNORMAL LOW (ref 8–23)
CALCIUM: 8 mg/dL — AB (ref 8.9–10.3)
CO2: 40 mmol/L — ABNORMAL HIGH (ref 22–32)
Chloride: 97 mmol/L — ABNORMAL LOW (ref 98–111)
Creatinine, Ser: 0.31 mg/dL — ABNORMAL LOW (ref 0.44–1.00)
GFR calc Af Amer: >60 mL/min (ref >60)
GFR calc non Af Amer: >60 mL/min (ref >60)
Glucose, Bld: 95 mg/dL (ref 70–99)
POTASSIUM: 3.4 mmol/L — AB (ref 3.5–5.1)
Sodium: 140 mmol/L (ref 135–145)

## 2018-02-06 LAB — MAGNESIUM: Magnesium: 1.7 mg/dL (ref 1.7–2.4)

## 2018-02-06 MED ORDER — FUROSEMIDE 40 MG PO TABS: 40.0000 mg | ORAL_TABLET | Freq: Every day | ORAL | 0 refills | Status: DC | PRN

## 2018-02-06 MED ORDER — POTASSIUM CHLORIDE 20 MEQ/15ML (10%) PO SOLN
20.0000 meq | Freq: Once | ORAL | Status: AC
  Administered 2018-02-06: 08:00:00 20 meq via ORAL
  Filled 2018-02-06: qty 15

## 2018-02-06 MED ORDER — METRONIDAZOLE 500 MG PO TABS: 500.0000 mg | ORAL_TABLET | Freq: Three times a day (TID) | ORAL | 0 refills | Status: AC

## 2018-02-06 MED ORDER — CIPROFLOXACIN HCL 500 MG PO TABS: 500.0000 mg | ORAL_TABLET | Freq: Two times a day (BID) | ORAL | 0 refills | Status: AC

## 2018-02-06 MED ORDER — POTASSIUM CHLORIDE 20 MEQ/15ML (10%) PO SOLN: 20.0000 meq | Freq: Every day | ORAL | Status: DC

## 2018-02-06 NOTE — Care Management Note (Signed)
Case Management Note  Patient Details  Name: Erin Horn MRN: [id number] Date of Birth: 09-Jul-1929  Subjective/Objective:  Patient to be discharged per MD order. Patient currently at home with hospice services, per patient and family it is their wish to resume. Spoke with liasion from [facility] and they are aware of patients pending discharge. Discharge summary faxed to them. Family prefers to provide transport.    Ines Bloomer RN BSN RNCM [phone]                 Action/Plan:   Expected Discharge Date:  02/06/18               Expected Discharge Plan:  Home w Hospice Care  In-House Referral:     Discharge planning Services  CM Consult  Post Acute Care Choice:    Choice offered to:  Patient, Adult Children  DME Arranged:    DME Agency:     HH Arranged:    [place] Agency:  [facility]  Status of Service:  Completed, signed off  If discussed at [place] of Stay Meetings, dates discussed:    Additional Comments:  Latanya Maudlin, RN 02/06/2018, 1:52 PM

## 2018-02-06 NOTE — Progress Notes (Signed)
Patient ID: Erin Horn, female   DOB: Oct 28, 1928, 84 y.o.   MRN: [id number]     [facility] Hospital Day(s): 3.   Post op day(s):  Marland Kitchen   Interval History: Patient seen and examined, no acute events or new complaints overnight. Patient reports feeling better. Refers rectal pain is better. Denies nausea and vomiting. During evaluation patient drinking coffee and tolerating well.   Vital signs in last 24 hours: [min-max] current  Temp:  [97.7 F (36.5 C)-99.2 F (37.3 C)] 98.4 F (36.9 C) (07/27 0516) Pulse Rate:  [68-81] 80 (07/27 0516) Resp:  [17-18] 17 (07/27 0516) BP: (125-136)/(47-76) 125/62 (07/27 0516) SpO2:  [97 %-100 %] 100 % (07/27 0516)     Height: 5' (152.4 cm) Weight: 42.2 kg (93 lb 1.6 oz) BMI (Calculated): 18.18    Physical Exam:  Constitutional: alert, cooperative and no distress  Gastrointestinal: soft, non-tender, and non-distended. Ostomy pink and patent with air and stool on bag.   Labs:  CBC Latest Ref Rng & Units 02/05/2018 02/04/2018 02/02/2018  WBC 3.6 - 11.0 K/uL 10.6 11.8(H) 18.3(H)  Hemoglobin 12.0 - 16.0 g/dL 10.3(L) 10.3(L) 11.3(L)  Hematocrit 35.0 - 47.0 % 31.2(L) 32.0(L) 34.6(L)  Platelets 150 - 440 K/uL 263 258 290   CMP Latest Ref Rng & Units 02/06/2018 02/05/2018 02/04/2018  Glucose 70 - 99 mg/dL 95 84 82  BUN 8 - 23 mg/dL 6(L) 11 13  Creatinine 0.44 - 1.00 mg/dL 0.31(L) 0.49 0.47  Sodium 135 - 145 mmol/L 140 139 141  Potassium 3.5 - 5.1 mmol/L 3.4(L) 3.9 3.4(L)  Chloride 98 - 111 mmol/L 97(L) 97(L) 94(L)  CO2 22 - 32 mmol/L 40(H) 34(H) 38(H)  Calcium 8.9 - 10.3 mg/dL 8.0(L) 7.9(L) 7.7(L)  Total Protein 6.5 - 8.1 g/dL - - -  Total Bilirubin 0.3 - 1.2 mg/dL - - -  Alkaline Phos 38 - 126 U/L - - -  AST 15 - 41 U/L - - -  ALT 0 - 44 U/L - - -    Imaging studies: No new pertinent imaging studies   Assessment/Plan:  84 y.o.femalewith small bowel obstruction, complicated by pertinent comorbidities includingunresectable advanced rectal  cancer, chronic COPD, severe malnourishment, and dehydration.  Today with resolved small bowel obstruction. Tolerating diet. Progressed to soft diet. Doing very good. May be discharged as tolerated.   All of the above findings and recommendations were discussed with the patient, patient's granddaughter, and all questions were answered to their expressed satisfaction.  [name], MD

## 2018-02-06 NOTE — Progress Notes (Signed)
Pharmacy Electrolyte Monitoring Consult:  Pharmacy consulted to assist in monitoring and replacing electrolytes in this 84 y.o. female admitted on 02/02/2018 with Emesis   Labs:  Sodium (mmol/L)  Date Value  02/06/2018 140   Potassium (mmol/L)  Date Value  02/06/2018 3.4 (L)   Magnesium (mg/dL)  Date Value  02/06/2018 1.7   Phosphorus (mg/dL)  Date Value  02/05/2018 2.8   Calcium (mg/dL)  Date Value  02/06/2018 8.0 (L)   Albumin (g/dL)  Date Value  02/02/2018 3.3 (L)    Assessment/Plan: K= 3.4. Will give KCl 20 mEq oral solution x 1 dose.  Larene Beach, PharmD Clinical Pharmacist 02/06/2018

## 2018-02-06 NOTE — Discharge Instructions (Signed)
Resume hospice services on discharge.

## 2018-02-06 NOTE — Progress Notes (Signed)
Pt discharged via wheelchair by auxillary to the visitor's entrance on portable O2 at 2L [place]

## 2018-02-06 NOTE — Discharge Summary (Signed)
[facility] NAME: Erin Horn    MR#:  [id number]  DATE OF BIRTH:  04/14/1929  DATE OF ADMISSION:  02/02/2018 ADMITTING PHYSICIAN: Arta Silence, MD  DATE OF DISCHARGE: 02/06/2018  PRIMARY CARE PHYSICIAN: Patient, No Pcp Per    ADMISSION DIAGNOSIS:  Enteritis [K52.9] Small bowel obstruction ([place]) [K56.609] Generalized abdominal pain [R10.84] Non-intractable vomiting with nausea, unspecified vomiting type [R11.2]  DISCHARGE DIAGNOSIS:  Active Problems:   Small bowel obstruction (HCC)   Pressure injury of skin   Protein-calorie malnutrition, severe   Enteritis  SECONDARY DIAGNOSIS:   Past Medical History:  Diagnosis Date  . Cancer [facility])     HOSPITAL COURSE:   *Small bowel obstruction NG tube clamped Appreciate surgery input  NGT removed- pt tolerated liquids and then soft food.  *Hypokalemia Secondary to GI fluid loss due to obstruction. Replace IV and oral, magnesium low - replaced  * Enteritis On IV ciprofloxacin and Flagyl. Change to PO  give 5 more days after discharge- total 8 days.  *Possible bronchopneumonia There may be a component of aspiration also. Cipro and Flagyl on board   DISCHARGE CONDITIONS:   Stable.  CONSULTS OBTAINED:  Treatment Team:  Arta Silence, MD Herbert Pun, MD Hillary Bow, MD  DRUG ALLERGIES:  No Known Allergies  DISCHARGE MEDICATIONS:   Allergies as of 02/06/2018   No Known Allergies     Medication List    STOP taking these medications   amLODipine 10 MG tablet Commonly known as:  NORVASC   potassium chloride 10 MEQ tablet Commonly known as:  K-DUR,KLOR-CON     TAKE these medications   acetaminophen 325 MG tablet Commonly known as:  TYLENOL Take 650 mg by mouth every 6 (six) hours as needed.   albuterol (2.5 MG/3ML) 0.083% nebulizer solution Commonly known as:  PROVENTIL Take 2.5 mg by nebulization every 6 (six) hours  as needed for wheezing or shortness of breath.   ciprofloxacin 500 MG tablet Commonly known as:  CIPRO Take 1 tablet (500 mg total) by mouth 2 (two) times daily for 5 days.   fluticasone 50 MCG/ACT nasal spray Commonly known as:  FLONASE Place 2 sprays into both nostrils daily.   furosemide 40 MG tablet Commonly known as:  LASIX Take 1 tablet (40 mg total) by mouth daily as needed for edema. Worsening swelling on legs or Shortness of breath What changed:    when to take this  reasons to take this  additional instructions   gabapentin 100 MG capsule Commonly known as:  NEURONTIN Take 200 mg by mouth 2 (two) times daily.   haloperidol 2 MG/ML solution Commonly known as:  HALDOL Take 1 mg by mouth every 4 (four) hours as needed for agitation.   hyoscyamine 0.125 MG SL tablet Commonly known as:  LEVSIN SL Place 0.125 mg under the tongue every 4 (four) hours as needed.   ipratropium 0.02 % nebulizer solution Commonly known as:  ATROVENT Take 0.5 mg by nebulization every 6 (six) hours as needed for wheezing or shortness of breath.   loratadine 10 MG tablet Commonly known as:  CLARITIN Take 10 mg by mouth daily.   LORazepam 0.5 MG tablet Commonly known as:  ATIVAN Take 0.5 mg by mouth every 4 (four) hours as needed for anxiety.   metroNIDAZOLE 500 MG tablet Commonly known as:  FLAGYL Take 1 tablet (500 mg total) by mouth every 8 (eight) hours for 5 days.  montelukast 10 MG tablet Commonly known as:  SINGULAIR Take 10 mg by mouth at bedtime.   morphine CONCENTRATE 10 mg / 0.5 ml concentrated solution Take 10-20 mg by mouth every 2 (two) hours as needed for severe pain.   morphine 15 MG 12 hr tablet Commonly known as:  MS CONTIN Take 15 mg by mouth 3 (three) times daily.   predniSONE 10 MG tablet Commonly known as:  DELTASONE Take 20 mg by mouth daily with breakfast.   sennosides-docusate sodium 8.6-50 MG tablet Commonly known as:  SENOKOT-S Take 1-2 tablets  by mouth daily as needed for constipation.        DISCHARGE INSTRUCTIONS:    Hospice to continue after discharge.  If you experience worsening of your admission symptoms, develop shortness of breath, life threatening emergency, suicidal or homicidal thoughts you must seek medical attention immediately by calling 911 or calling your MD immediately  if symptoms less severe.  You Must read complete instructions/literature along with all the possible adverse reactions/side effects for all the Medicines you take and that have been prescribed to you. Take any new Medicines after you have completely understood and accept all the possible adverse reactions/side effects.   Please note  You were cared for by a hospitalist during your hospital stay. If you have any questions about your discharge medications or the care you received while you were in the hospital after you are discharged, you can call the unit and asked to speak with the hospitalist on call if the hospitalist that took care of you is not available. Once you are discharged, your primary care physician will handle any further medical issues. Please note that NO REFILLS for any discharge medications will be authorized once you are discharged, as it is imperative that you return to your primary care physician (or establish a relationship with a primary care physician if you do not have one) for your aftercare needs so that they can reassess your need for medications and monitor your lab values.    Today   CHIEF COMPLAINT:   Chief Complaint  Patient presents with  . Emesis    HISTORY OF PRESENT ILLNESS:  Erin Horn  is a 84 y.o. female with a known history of COPD (2L [employer] [place] chronic home O2), long-standing Hx rectal Ca (w/ colostomy) p/w AP/N/V. Pt lived in [place], and moved to [place] with her family ~3wks ago. She apparently had radiation therapy for rectal mass last year, but refused subsequent chemoTx. As a result, the  radiation shrank the mass, but the mass regained size in the interrim. In 08/2017, colostomy was done for comfort. Pt was in rehab x18mo. In the interim, it seems she has been told by her Oncologists that there are no further treatment options available. As such, has been receiving home care, though she has not received a designation of terminal disease w/ 61mo life expectancy (hospice) as yet. Pt's family tells me that over weekend, pt had small qty rectal bleeding as well as bleeding into ostomy bag. She had diffuse crampy AP and N/V episodes xTNTC, (-) blood. Despite N/V, pt has still had good appetite, but has not been able to keep anything down. Vomitus characterized as dark/malodorous, description suggestive of feculent vomiting. CT A/P (+) "Multiple dilated small bowel loops with multiple air-fluid levels and diffuse small bowel wall thickening. Changes likely to indicate small bowel obstruction with enteritis. Transition zone is not identified." NGT placed.   VITAL SIGNS:  Blood pressure 125/62,  pulse 80, temperature 98.4 F (36.9 C), temperature source Oral, resp. rate 17, height 5' (1.524 m), weight 42.2 kg (93 lb 1.6 oz), SpO2 100 %.  I/O:    Intake/Output Summary (Last 24 hours) at 02/06/2018 1148 Last data filed at 02/05/2018 1838 Gross per 24 hour  Intake 241.76 ml  Output 500 ml  Net -258.24 ml    PHYSICAL EXAMINATION:   GENERAL:  84 y.o.-year-old patient lying in the bed with no acute distress.  EYES: Pupils equal, round, reactive to light and accommodation. No scleral icterus. Extraocular muscles intact.  HEENT: Head atraumatic, normocephalic. Oropharynx and nasopharynx clear.  NECK:  Supple, no jugular venous distention. No thyroid enlargement, no tenderness.  LUNGS: Normal breath sounds bilaterally, no wheezing, b/l crepitation. No use of accessory muscles of respiration. Have nasal canula oxygen. CARDIOVASCULAR: S1, S2 normal. No murmurs, rubs, or gallops.  ABDOMEN: Soft,  nontender, nondistended. Bowel sounds present. No organomegaly or mass. Colostomy bag in place. EXTREMITIES: No pedal edema, cyanosis, or clubbing.  NEUROLOGIC: Cranial nerves II through XII are intact. Muscle strength 4/5 in all extremities. Sensation intact. Gait not checked.  PSYCHIATRIC: The patient is alert and oriented x 3.  SKIN: No obvious rash, lesion, or ulcer.    DATA REVIEW:   CBC Recent Labs  Lab 02/05/18 0518  WBC 10.6  HGB 10.3*  HCT 31.2*  PLT 263    Chemistries  Recent Labs  Lab 02/02/18 2245  02/06/18 0519  NA 140   < > 140  K 3.1*   < > 3.4*  CL 92*   < > 97*  CO2 40*   < > 40*  GLUCOSE 127*   < > 95  BUN 16   < > 6*  CREATININE 0.60   < > 0.31*  CALCIUM 7.7*   < > 8.0*  MG  --    < > 1.7  AST 25  --   --   ALT 31  --   --   ALKPHOS 98  --   --   BILITOT 0.6  --   --    < > = values in this interval not displayed.    Cardiac Enzymes No results for input(s): TROPONINI in the last 168 hours.  Microbiology Results  No results found for this or any previous visit.  RADIOLOGY:  No results found.  EKG:   Orders placed or performed during the hospital encounter of 02/02/18  . ED EKG  . ED EKG  . EKG 12-Lead  . EKG 12-Lead    Management plans discussed with the patient, family and they are in agreement.  CODE STATUS: DNR    Code Status Orders  (From admission, onward)        Start     Ordered   02/03/18 0340  Do not attempt resuscitation (DNR)  Continuous    Question Answer Comment  In the event of cardiac or respiratory ARREST Do not call a "code blue"   In the event of cardiac or respiratory ARREST Do not perform Intubation, CPR, defibrillation or ACLS   In the event of cardiac or respiratory ARREST Use medication by any route, position, wound care, and other measures to relive pain and suffering. May use oxygen, suction and manual treatment of airway obstruction as needed for comfort.      02/03/18 0340    Code Status History     This patient has a current code status but no historical code status.  Advance Directive Documentation     Most Recent Value  Type of Advance Directive  Out of facility DNR (pink MOST or yellow form)  Pre-existing out of facility DNR order (yellow form or pink MOST form)  Yellow form placed in chart (order not valid for inpatient use)  "MOST" Form in Place?  -      TOTAL TIME TAKING CARE OF THIS PATIENT: 35 minutes.    Vaughan Basta M.D on 02/06/2018 at 11:48 AM  Between 7am to 6pm - Pager - [phone]  After 6pm go to [email or web address] - password [email or web address] [place] [facility]  Office  [phone]  CC: Primary care physician; Patient, No Pcp Per   Note: This dictation was prepared with Dragon dictation along with smaller phrase technology. Any transcriptional errors that result from this process are unintentional.

## 2018-02-06 NOTE — Progress Notes (Signed)
MD order in [facility] received to discharge pt home with hospice today; Care Management previously reestablished Hospice services in the home for the pt; vebally reviewed AVS with pt, gave Rxs for Flagyl and Cipro to the pt, Lasix Rx was electronically submitted to [facility]; no further questions voiced at this time; out of facility DNR paperwork given to the pt; pt getting dressed for discharge and will be converted to her portable oxygen; discharge pending arrival of auxillary to take the pt to the visitor's entrance

## 2018-02-09 ENCOUNTER — Telehealth: Payer: Self-pay

## 2018-02-09 NOTE — Telephone Encounter (Signed)
EMMI Follow-up: Noted on the report there was a question about discharge papers. I called to talk with Erin Horn and family member said she was sleeping. I left my number in case patient had questions about discharge paperwork.  Let her know there would also be another automated call with a different series of questions and to let us know if she had any needs at that time.

## 2018-03-28 ENCOUNTER — Emergency Department

## 2018-03-28 ENCOUNTER — Encounter: Payer: Self-pay | Admitting: Emergency Medicine

## 2018-03-28 ENCOUNTER — Inpatient Hospital Stay
Admission: EM | Admit: 2018-03-28 | Discharge: 2018-03-29 | DRG: 388 | Disposition: A | Discharge status: Hospice | Attending: Internal Medicine | Admitting: Internal Medicine

## 2018-03-28 DIAGNOSIS — J189 Pneumonia, unspecified organism: Secondary | ICD-10-CM | POA: Diagnosis present

## 2018-03-28 DIAGNOSIS — Z923 Personal history of irradiation: Secondary | ICD-10-CM | POA: Diagnosis not present

## 2018-03-28 DIAGNOSIS — K56609 Unspecified intestinal obstruction, unspecified as to partial versus complete obstruction: Secondary | ICD-10-CM | POA: Diagnosis not present

## 2018-03-28 DIAGNOSIS — Z66 Do not resuscitate: Secondary | ICD-10-CM | POA: Diagnosis present

## 2018-03-28 DIAGNOSIS — J9621 Acute and chronic respiratory failure with hypoxia: Secondary | ICD-10-CM | POA: Diagnosis present

## 2018-03-28 DIAGNOSIS — Z87891 Personal history of nicotine dependence: Secondary | ICD-10-CM | POA: Diagnosis not present

## 2018-03-28 DIAGNOSIS — K56600 Partial intestinal obstruction, unspecified as to cause: Principal | ICD-10-CM | POA: Diagnosis present

## 2018-03-28 DIAGNOSIS — L89151 Pressure ulcer of sacral region, stage 1: Secondary | ICD-10-CM | POA: Diagnosis present

## 2018-03-28 DIAGNOSIS — Z7951 Long term (current) use of inhaled steroids: Secondary | ICD-10-CM | POA: Diagnosis not present

## 2018-03-28 DIAGNOSIS — Z79891 Long term (current) use of opiate analgesic: Secondary | ICD-10-CM

## 2018-03-28 DIAGNOSIS — Y95 Nosocomial condition: Secondary | ICD-10-CM | POA: Diagnosis present

## 2018-03-28 DIAGNOSIS — A0472 Enterocolitis due to Clostridium difficile, not specified as recurrent: Secondary | ICD-10-CM | POA: Diagnosis present

## 2018-03-28 DIAGNOSIS — Z681 Body mass index (BMI) 19 or less, adult: Secondary | ICD-10-CM

## 2018-03-28 DIAGNOSIS — E876 Hypokalemia: Secondary | ICD-10-CM | POA: Diagnosis present

## 2018-03-28 DIAGNOSIS — J441 Chronic obstructive pulmonary disease with (acute) exacerbation: Secondary | ICD-10-CM | POA: Diagnosis present

## 2018-03-28 DIAGNOSIS — E43 Unspecified severe protein-calorie malnutrition: Secondary | ICD-10-CM | POA: Diagnosis present

## 2018-03-28 DIAGNOSIS — Z9981 Dependence on supplemental oxygen: Secondary | ICD-10-CM

## 2018-03-28 DIAGNOSIS — C19 Malignant neoplasm of rectosigmoid junction: Secondary | ICD-10-CM | POA: Diagnosis present

## 2018-03-28 DIAGNOSIS — J440 Chronic obstructive pulmonary disease with acute lower respiratory infection: Secondary | ICD-10-CM | POA: Diagnosis present

## 2018-03-28 DIAGNOSIS — Z79899 Other long term (current) drug therapy: Secondary | ICD-10-CM

## 2018-03-28 DIAGNOSIS — Z933 Colostomy status: Secondary | ICD-10-CM | POA: Diagnosis not present

## 2018-03-28 DIAGNOSIS — Z4659 Encounter for fitting and adjustment of other gastrointestinal appliance and device: Secondary | ICD-10-CM

## 2018-03-28 HISTORY — DX: Chronic obstructive pulmonary disease, unspecified: J44.9

## 2018-03-28 LAB — CBC WITH DIFFERENTIAL/PLATELET
BASOS PCT: 0 %
Basophils Absolute: 0 10*3/uL (ref 0–0.1)
EOS ABS: 0 10*3/uL (ref 0–0.7)
Eosinophils Relative: 0 %
HCT: 30.7 % — ABNORMAL LOW (ref 35.0–47.0)
HEMOGLOBIN: 10.2 g/dL — AB (ref 12.0–16.0)
Lymphocytes Relative: 2 %
Lymphs Abs: 0.4 10*3/uL — ABNORMAL LOW (ref 1.0–3.6)
MCH: 26.4 pg (ref 26.0–34.0)
MCHC: 33.2 g/dL (ref 32.0–36.0)
MCV: 79.5 fL — ABNORMAL LOW (ref 80.0–100.0)
MONO ABS: 0.6 10*3/uL (ref 0.2–0.9)
MONOS PCT: 3 %
NEUTROS PCT: 95 %
Neutro Abs: 17.8 10*3/uL — ABNORMAL HIGH (ref 1.4–6.5)
PLATELETS: 249 10*3/uL (ref 150–440)
RBC: 3.87 MIL/uL (ref 3.80–5.20)
RDW: 15.8 % — ABNORMAL HIGH (ref 11.5–14.5)
WBC: 18.9 10*3/uL — ABNORMAL HIGH (ref 3.6–11.0)

## 2018-03-28 LAB — BASIC METABOLIC PANEL
Anion gap: 6 (ref 5–15)
BUN: 9 mg/dL (ref 8–23)
CALCIUM: 7.7 mg/dL — AB (ref 8.9–10.3)
CO2: 35 mmol/L — ABNORMAL HIGH (ref 22–32)
CREATININE: 0.33 mg/dL — AB (ref 0.44–1.00)
Chloride: 94 mmol/L — ABNORMAL LOW (ref 98–111)
Glucose, Bld: 140 mg/dL — ABNORMAL HIGH (ref 70–99)
Potassium: 3.2 mmol/L — ABNORMAL LOW (ref 3.5–5.1)
SODIUM: 135 mmol/L (ref 135–145)

## 2018-03-28 MED ORDER — SODIUM CHLORIDE 0.9 % IV SOLN
1.0000 g | Freq: Once | INTRAVENOUS | Status: AC
  Administered 2018-03-29: 1 g via INTRAVENOUS
  Filled 2018-03-28: qty 1

## 2018-03-28 MED ORDER — IOHEXOL 300 MG/ML  SOLN
75.0000 mL | Freq: Once | INTRAMUSCULAR | Status: AC | PRN
  Administered 2018-03-28: 75 mL via INTRAVENOUS

## 2018-03-28 MED ORDER — VANCOMYCIN HCL IN DEXTROSE 1-5 GM/200ML-% IV SOLN
1000.0000 mg | Freq: Once | INTRAVENOUS | Status: AC
  Administered 2018-03-29: 03:00:00 1000 mg via INTRAVENOUS
  Filled 2018-03-28: qty 200

## 2018-03-28 NOTE — ED Triage Notes (Cosign Needed)
Patient brought in by family from home.  Per family patient was vomiting last night and this morning. Patient states that today she felt like her heart was racing and became diaphoretic. Family states that she has had intermittent chest pain since this morning.

## 2018-03-28 NOTE — ED Provider Notes (Signed)
[facility] Emergency Department Provider Note  ____________________________________________   I have reviewed the triage vital signs and the nursing notes.   HISTORY  Chief Complaint Chest Pain   History limited by: Not Limited   HPI Erin Horn is a 84 y.o. female who presents to the emergency department today because of concerns for abdominal discomfort and nausea vomiting.  Patient states she started feeling poorly yesterday.  She had multiple episodes of vomiting.  Hospice nurse came out.  She does have an ostomy and had had decreased output.  Ostomy nurse was able to get a slight output.  However after this morning she no longer had any output.  She had a couple episodes of nonbloody emesis.  The patient states that she was in the hospital last month for small bowel obstruction and this feels similar.  Per medical record review patient has a history of recent admission for small bowel obstruction.  Past Medical History:  Diagnosis Date  . Cancer ([place])    colon  . COPD (chronic obstructive pulmonary disease) [facility])     Patient Active Problem List   Diagnosis Date Noted  . Small bowel obstruction ([place]) 02/03/2018  . Pressure injury of skin 02/03/2018  . Protein-calorie malnutrition, severe 02/03/2018    Past Surgical History:  Procedure Laterality Date  . ABDOMINAL HYSTERECTOMY    . CHOLECYSTECTOMY    . COLOSTOMY      Prior to Admission medications   Medication Sig Start Date End Date Taking? Authorizing Provider  acetaminophen (TYLENOL) 325 MG tablet Take 650 mg by mouth every 6 (six) hours as needed.    [provider]  albuterol (PROVENTIL) (2.5 MG/3ML) 0.083% nebulizer solution Take 2.5 mg by nebulization every 6 (six) hours as needed for wheezing or shortness of breath.    [provider]  fluticasone (FLONASE) 50 MCG/ACT nasal spray Place 2 sprays into both nostrils daily.    [provider]  furosemide (LASIX)  40 MG tablet Take 1 tablet (40 mg total) by mouth daily as needed for edema. Worsening swelling on legs or Shortness of breath 02/06/18   Vaughan Basta, MD  gabapentin (NEURONTIN) 100 MG capsule Take 200 mg by mouth 2 (two) times daily.    [provider]  haloperidol (HALDOL) 2 MG/ML solution Take 1 mg by mouth every 4 (four) hours as needed for agitation.    [provider]  hyoscyamine (LEVSIN SL) 0.125 MG SL tablet Place 0.125 mg under the tongue every 4 (four) hours as needed.    [provider]  ipratropium (ATROVENT) 0.02 % nebulizer solution Take 0.5 mg by nebulization every 6 (six) hours as needed for wheezing or shortness of breath.    [provider]  loratadine (CLARITIN) 10 MG tablet Take 10 mg by mouth daily.    [provider]  LORazepam (ATIVAN) 0.5 MG tablet Take 0.5 mg by mouth every 4 (four) hours as needed for anxiety.    [provider]  montelukast (SINGULAIR) 10 MG tablet Take 10 mg by mouth at bedtime.    [provider]  morphine (MS CONTIN) 15 MG 12 hr tablet Take 15 mg by mouth 3 (three) times daily.    [provider]  Morphine Sulfate (MORPHINE CONCENTRATE) 10 mg / 0.5 ml concentrated solution Take 10-20 mg by mouth every 2 (two) hours as needed for severe pain.    [provider]  predniSONE (DELTASONE) 10 MG tablet Take 20 mg by mouth  daily with breakfast.    [provider]  sennosides-docusate sodium (SENOKOT-S) 8.6-50 MG tablet Take 1-2 tablets by mouth daily as needed for constipation.    [provider]    Allergies Patient has no known allergies.  No family history on file.  Social History Social History   Tobacco Use  . Smoking status: Former Smoker    Types: Cigarettes  . Smokeless tobacco: Never Used  Substance Use Topics  . Alcohol use: Not Currently  . Drug use: Never    Review of Systems Constitutional: No fever/chills Eyes: No visual  changes. ENT: No sore throat. Cardiovascular: Denies chest pain. Respiratory: Denies shortness of breath. Gastrointestinal:  Positive for abdominal pain, nausea and vomiting.  Genitourinary: Negative for dysuria. Musculoskeletal: Negative for back pain. Skin: Negative for rash. Neurological: Negative for headaches, focal weakness or numbness.  ____________________________________________   PHYSICAL EXAM:  VITAL SIGNS: ED Triage Vitals  Enc Vitals Group     BP 03/28/18 2018 120/62     Pulse Rate 03/28/18 2018 97     Resp 03/28/18 2018 20     Temp 03/28/18 2018 98.2 F (36.8 C)     Temp Source 03/28/18 2018 Oral     SpO2 03/28/18 2018 97 %     Weight 03/28/18 2014 91 lb (41.3 kg)     Height 03/28/18 2014 5\' 1"  (1.549 m)     Head Circumference --      Peak Flow --      Pain Score 03/28/18 2014 0   Constitutional: Alert and oriented.  Eyes: Conjunctivae are normal.  ENT      Head: Normocephalic and atraumatic.      Nose: No congestion/rhinnorhea.      Mouth/Throat: Mucous membranes are moist.      Neck: No stridor. Hematological/Lymphatic/Immunilogical: No cervical lymphadenopathy. Cardiovascular: Normal rate, regular rhythm.  No murmurs, rubs, or gallops. Respiratory: Normal respiratory effort without tachypnea nor retractions. Breath sounds are clear and equal bilaterally. No wheezes/rales/rhonchi. Gastrointestinal: Soft and somewhat diffusely tender. Ostomy bag with small amount of non bloody output in it. No rebound. No guarding.  Genitourinary: Deferred Musculoskeletal: Normal range of motion in all extremities. No lower extremity edema. Neurologic:  Normal speech and language. No gross focal neurologic deficits are appreciated.  Skin:  Skin is warm, dry and intact. No rash noted. Psychiatric: Mood and affect are normal. Speech and behavior are normal. Patient exhibits appropriate insight and judgment.  ____________________________________________    LABS  (pertinent positives/negatives)  CBC wbc 18.9, hgb 10.2, plt 249 BMP na 135, k 3.2, glu 140, cr 0.33  ____________________________________________   EKG  I, Nance Pear, attending physician, personally viewed and interpreted this EKG  EKG Time: 2019 Rate: 95 Rhythm: sinus rhythm Axis: normal Intervals: qtc 495 QRS: q waves v1, v2 ST changes: no st elevation Impression: abnormal ekg  ____________________________________________    RADIOLOGY  CT abd/pel Concern for SBO, concern for pneumonia  ____________________________________________   PROCEDURES  Procedures  ____________________________________________   INITIAL IMPRESSION / ASSESSMENT AND PLAN / ED COURSE  Pertinent labs & imaging results that were available during my care of the patient were reviewed by me and considered in my medical decision making (see chart for details).   Patient presented to the emergency department today with concerns for nausea vomiting and abdominal discomfort and decreased ostomy output. Concern for recurrent SBO. CT scan does show findings concerning for SBO. However it also shows possible pneumonia. This might explain some of  the patient's chest pain and leukocytosis. Will place NG tube and start abx. Discussed plan and findings with patient.  ____________________________________________   FINAL CLINICAL IMPRESSION(S) / ED DIAGNOSES  Final diagnoses:  Healthcare-associated pneumonia  Small bowel obstruction ([place])     Note: This dictation was prepared with Dragon dictation. Any transcriptional errors that result from this process are unintentional     Nance Pear, MD 03/28/18 2248

## 2018-03-28 NOTE — ED Notes (Addendum)
Pt is hospice for colorectal mass. Has not vomited today. Felt like her heart was racing and felt diaphoretic. vss and skin warm - nonfebrile. No complaints at this time. Pt a&ox3 and can answer questions. Family at bedside as well.

## 2018-03-29 ENCOUNTER — Other Ambulatory Visit: Payer: Self-pay

## 2018-03-29 ENCOUNTER — Inpatient Hospital Stay

## 2018-03-29 DIAGNOSIS — K56609 Unspecified intestinal obstruction, unspecified as to partial versus complete obstruction: Secondary | ICD-10-CM

## 2018-03-29 LAB — BASIC METABOLIC PANEL
Anion gap: 4 — ABNORMAL LOW (ref 5–15)
BUN: 8 mg/dL (ref 8–23)
CO2: 37 mmol/L — AB (ref 22–32)
Calcium: 7.9 mg/dL — ABNORMAL LOW (ref 8.9–10.3)
Chloride: 96 mmol/L — ABNORMAL LOW (ref 98–111)
Creatinine, Ser: 0.34 mg/dL — ABNORMAL LOW (ref 0.44–1.00)
GFR calc Af Amer: >60 mL/min (ref >60)
GLUCOSE: 99 mg/dL (ref 70–99)
POTASSIUM: 2.8 mmol/L — AB (ref 3.5–5.1)
Sodium: 137 mmol/L (ref 135–145)

## 2018-03-29 LAB — CBC
HEMATOCRIT: 31.9 % — AB (ref 35.0–47.0)
Hemoglobin: 10.5 g/dL — ABNORMAL LOW (ref 12.0–16.0)
MCH: 26.2 pg (ref 26.0–34.0)
MCHC: 32.7 g/dL (ref 32.0–36.0)
MCV: 80.1 fL (ref 80.0–100.0)
Platelets: 252 10*3/uL (ref 150–440)
RBC: 3.99 MIL/uL (ref 3.80–5.20)
RDW: 15.5 % — AB (ref 11.5–14.5)
WBC: 18.1 10*3/uL — ABNORMAL HIGH (ref 3.6–11.0)

## 2018-03-29 LAB — CLOSTRIDIUM DIFFICILE BY PCR, REFLEXED: Toxigenic C. Difficile by PCR: POSITIVE — AB

## 2018-03-29 LAB — C DIFFICILE QUICK SCREEN W PCR REFLEX
C Diff antigen: POSITIVE — AB
C Diff toxin: NEGATIVE

## 2018-03-29 LAB — LACTIC ACID, PLASMA
Lactic Acid, Venous: 0.6 mmol/L (ref 0.5–1.9)
Lactic Acid, Venous: 0.8 mmol/L (ref 0.5–1.9)

## 2018-03-29 LAB — MAGNESIUM: Magnesium: 1.3 mg/dL — ABNORMAL LOW (ref 1.7–2.4)

## 2018-03-29 LAB — GLUCOSE, CAPILLARY: Glucose-Capillary: 88 mg/dL (ref 70–99)

## 2018-03-29 MED ORDER — HALOPERIDOL LACTATE 5 MG/ML IJ SOLN: 1.0000 mg | Freq: Four times a day (QID) | INTRAMUSCULAR | Status: DC | PRN

## 2018-03-29 MED ORDER — MORPHINE SULFATE ER 15 MG PO TBCR
15.0000 mg | EXTENDED_RELEASE_TABLET | Freq: Two times a day (BID) | ORAL | Status: DC
  Administered 2018-03-29: 15 mg via ORAL
  Filled 2018-03-29: qty 1

## 2018-03-29 MED ORDER — SENNOSIDES-DOCUSATE SODIUM 8.6-50 MG PO TABS: 1.0000 | ORAL_TABLET | Freq: Every day | ORAL | Status: DC | PRN

## 2018-03-29 MED ORDER — MONTELUKAST SODIUM 10 MG PO TABS: 10.0000 mg | ORAL_TABLET | Freq: Every day | ORAL | Status: DC

## 2018-03-29 MED ORDER — HALOPERIDOL LACTATE 2 MG/ML PO CONC
1.0000 mg | ORAL | Status: DC | PRN
  Filled 2018-03-29: qty 0.5

## 2018-03-29 MED ORDER — ONDANSETRON HCL 4 MG/2ML IJ SOLN: 4.0000 mg | Freq: Four times a day (QID) | INTRAMUSCULAR | Status: DC | PRN

## 2018-03-29 MED ORDER — AMOXICILLIN-POT CLAVULANATE 875-125 MG PO TABS
1.0000 | ORAL_TABLET | Freq: Two times a day (BID) | ORAL | Status: DC
  Administered 2018-03-29: 13:00:00 1 via ORAL
  Filled 2018-03-29: qty 1

## 2018-03-29 MED ORDER — HYDROCODONE-ACETAMINOPHEN 5-325 MG PO TABS
1.0000 | ORAL_TABLET | ORAL | Status: DC | PRN
  Administered 2018-03-29: 2 via ORAL
  Administered 2018-03-29: 05:00:00 1 via ORAL
  Filled 2018-03-29: qty 1
  Filled 2018-03-29: qty 2

## 2018-03-29 MED ORDER — MAGNESIUM SULFATE 2 GM/50ML IV SOLN
2.0000 g | Freq: Once | INTRAVENOUS | Status: AC
  Administered 2018-03-29: 2 g via INTRAVENOUS
  Filled 2018-03-29: qty 50

## 2018-03-29 MED ORDER — LORATADINE 10 MG PO TABS
10.0000 mg | ORAL_TABLET | Freq: Every day | ORAL | Status: DC
  Administered 2018-03-29: 10 mg via ORAL
  Filled 2018-03-29: qty 1

## 2018-03-29 MED ORDER — VANCOMYCIN HCL 125 MG PO CAPS: 125.0000 mg | ORAL_CAPSULE | Freq: Four times a day (QID) | ORAL | 0 refills | Status: AC

## 2018-03-29 MED ORDER — FLUTICASONE PROPIONATE 50 MCG/ACT NA SUSP
2.0000 | Freq: Every day | NASAL | Status: DC
  Administered 2018-03-29: 09:00:00 2 via NASAL
  Filled 2018-03-29: qty 16

## 2018-03-29 MED ORDER — TRAZODONE HCL 50 MG PO TABS: 25.0000 mg | ORAL_TABLET | Freq: Every evening | ORAL | Status: DC | PRN

## 2018-03-29 MED ORDER — AMOXICILLIN-POT CLAVULANATE 875-125 MG PO TABS: 1.0000 | ORAL_TABLET | Freq: Two times a day (BID) | ORAL | 0 refills | Status: AC

## 2018-03-29 MED ORDER — BISACODYL 5 MG PO TBEC: 5.0000 mg | DELAYED_RELEASE_TABLET | Freq: Every day | ORAL | Status: DC | PRN

## 2018-03-29 MED ORDER — SODIUM CHLORIDE 0.9 % IV SOLN
INTRAVENOUS | Status: DC
  Administered 2018-03-29: 02:00:00 via INTRAVENOUS

## 2018-03-29 MED ORDER — LORAZEPAM 0.5 MG PO TABS
0.5000 mg | ORAL_TABLET | ORAL | Status: DC | PRN
  Administered 2018-03-29: 05:00:00 0.5 mg via ORAL
  Filled 2018-03-29: qty 1

## 2018-03-29 MED ORDER — PREDNISONE 20 MG PO TABS
20.0000 mg | ORAL_TABLET | Freq: Every day | ORAL | Status: DC
  Administered 2018-03-29: 09:00:00 20 mg via ORAL
  Filled 2018-03-29: qty 1

## 2018-03-29 MED ORDER — IPRATROPIUM-ALBUTEROL 0.5-2.5 (3) MG/3ML IN SOLN
3.0000 mL | Freq: Four times a day (QID) | RESPIRATORY_TRACT | Status: DC | PRN
  Administered 2018-03-29 (×3): 3 mL via RESPIRATORY_TRACT
  Filled 2018-03-29 (×3): qty 3

## 2018-03-29 MED ORDER — IPRATROPIUM BROMIDE 0.02 % IN SOLN: 0.5000 mg | Freq: Four times a day (QID) | RESPIRATORY_TRACT | Status: DC | PRN

## 2018-03-29 MED ORDER — VANCOMYCIN 50 MG/ML ORAL SOLUTION
125.0000 mg | Freq: Four times a day (QID) | ORAL | Status: DC
  Administered 2018-03-29: 125 mg via ORAL
  Filled 2018-03-29 (×3): qty 2.5

## 2018-03-29 MED ORDER — HEPARIN SODIUM (PORCINE) 5000 UNIT/ML IJ SOLN
5000.0000 [IU] | Freq: Three times a day (TID) | INTRAMUSCULAR | Status: DC
  Administered 2018-03-29 (×2): 5000 [IU] via SUBCUTANEOUS
  Filled 2018-03-29 (×2): qty 1

## 2018-03-29 MED ORDER — ALBUTEROL SULFATE (2.5 MG/3ML) 0.083% IN NEBU: 2.5000 mg | INHALATION_SOLUTION | Freq: Four times a day (QID) | RESPIRATORY_TRACT | Status: DC | PRN

## 2018-03-29 MED ORDER — VANCOMYCIN HCL IN DEXTROSE 750-5 MG/150ML-% IV SOLN: 750.0000 mg | INTRAVENOUS | Status: DC

## 2018-03-29 MED ORDER — GABAPENTIN 100 MG PO CAPS
200.0000 mg | ORAL_CAPSULE | Freq: Two times a day (BID) | ORAL | Status: DC
  Administered 2018-03-29: 200 mg via ORAL
  Filled 2018-03-29: qty 2

## 2018-03-29 MED ORDER — POTASSIUM CHLORIDE IN NACL 20-0.9 MEQ/L-% IV SOLN
INTRAVENOUS | Status: DC
  Administered 2018-03-29: 07:00:00 via INTRAVENOUS
  Filled 2018-03-29 (×3): qty 1000

## 2018-03-29 MED ORDER — METRONIDAZOLE IN NACL 5-0.79 MG/ML-% IV SOLN
500.0000 mg | Freq: Three times a day (TID) | INTRAVENOUS | Status: DC
  Administered 2018-03-29 (×2): 500 mg via INTRAVENOUS
  Filled 2018-03-29 (×4): qty 100

## 2018-03-29 MED ORDER — SODIUM CHLORIDE 0.9 % IV SOLN: 1.0000 g | INTRAVENOUS | Status: DC

## 2018-03-29 MED ORDER — ONDANSETRON HCL 4 MG PO TABS: 4.0000 mg | ORAL_TABLET | Freq: Four times a day (QID) | ORAL | Status: DC | PRN

## 2018-03-29 MED ORDER — DOCUSATE SODIUM 100 MG PO CAPS
100.0000 mg | ORAL_CAPSULE | Freq: Two times a day (BID) | ORAL | Status: DC
  Administered 2018-03-29: 13:00:00 100 mg via ORAL
  Filled 2018-03-29 (×2): qty 1

## 2018-03-29 MED ORDER — ACETAMINOPHEN 325 MG PO TABS
650.0000 mg | ORAL_TABLET | Freq: Four times a day (QID) | ORAL | Status: DC | PRN
  Filled 2018-03-29: qty 2

## 2018-03-29 MED ORDER — POTASSIUM CHLORIDE 20 MEQ PO PACK
40.0000 meq | PACK | ORAL | Status: AC
  Administered 2018-03-29: 13:00:00 40 meq via ORAL
  Filled 2018-03-29: qty 2

## 2018-03-29 MED ORDER — ACETAMINOPHEN 650 MG RE SUPP: 650.0000 mg | Freq: Four times a day (QID) | RECTAL | Status: DC | PRN

## 2018-03-29 NOTE — Care Management (Signed)
Patient active with Erin Horn. Santiago Glad  with hospice aware of discharge.

## 2018-03-29 NOTE — Progress Notes (Signed)
Pharmacy Antibiotic Note  Erin Horn is a 84 y.o. female admitted on 03/28/2018 with sepsis.  Pharmacy has been consulted for vanc/cefepime dosing. Patient vanc 1g, cefepime 1g IV x 1  Plan: Will continue vanc 750 mg IV q24h  Will draw VT 09/19 @ 0200 prior to 4th doses Will continue cefepime 1g IV q24h   Ke 0.0319 T1/2 24 hours Goal trough 15 - 20 mcg/mL  Height: 5\' 2"  (157.5 cm) Weight: 93 lb 9.6 oz (42.5 kg) IBW/kg (Calculated) : 50.1  Temp (24hrs), Avg:98.2 F (36.8 C), Min:98.1 F (36.7 C), Max:98.2 F (36.8 C)  Recent Labs  Lab 03/28/18 2050 03/29/18 0213  WBC 18.9*  --   CREATININE 0.33*  --   LATICACIDVEN  --  0.6    Estimated Creatinine Clearance: 33.2 mL/min (A) (by C-G formula based on SCr of 0.33 mg/dL (L)).    No Known Allergies  Thank you for allowing pharmacy to be a part of this patient's care.  Tobie Lords, PharmD, BCPS Clinical Pharmacist 03/29/2018

## 2018-03-29 NOTE — Consult Note (Signed)
Surgical Consultation  03/29/2018  Ginnette Gates is an 84 y.o. female.   Referring Physician: Dr. Prudence Davidson  CC: Generalized pain  HPI: Patient was admitted through the emergency room last night with a diagnosis of bowel obstruction.  This morning on consultation the patient's chief complaint is back pain arm pain leg pain and headache.  She also states her abdomen is causing her some pain but very minimal.  She has gas in her bag today that was not present yesterday.  Patient had a palliative colostomy for rectal cancer when she failed radiotherapy.  This was done in [place].  Records are not available.  She is DNR and on hospice at this time.  Is a gastric tube is been placed  Past Medical History:  Diagnosis Date  . Cancer ([place])    colon  . COPD (chronic obstructive pulmonary disease) ([place])     Past Surgical History:  Procedure Laterality Date  . ABDOMINAL HYSTERECTOMY    . CHOLECYSTECTOMY    . COLOSTOMY      No family history on file.  Social History:  reports that she has quit smoking. Her smoking use included cigarettes. She has never used smokeless tobacco. She reports that she drank alcohol. She reports that she does not use drugs.  Allergies: No Known Allergies  Medications reviewed.   Review of Systems:   Review of Systems  Constitutional: Negative for chills and fever.  HENT: Negative.   Eyes: Negative.   Respiratory: Negative.   Cardiovascular: Negative.   Gastrointestinal: Positive for abdominal pain and constipation. Negative for nausea and vomiting.  Genitourinary: Negative.   Musculoskeletal: Positive for joint pain and myalgias.  Skin: Negative.   Neurological: Negative.   Endo/Heme/Allergies: Negative.   Psychiatric/Behavioral: Negative.      Physical Exam:  BP (!) 151/66 (BP Location: Left Arm)   Pulse 94   Temp 98.1 F (36.7 C) (Oral)   Resp 18   Ht _0  (1.575 m)   Wt 42.5 kg   SpO2 94%   BMI 17.12 kg/m   Physical Exam   Constitutional:  Non-toxic appearance. She does not appear ill.  Nontoxic nondistressed appearing patient she appears cachectic.  And nasogastric tube is in place but appears to have been pulled out considerably.  HENT:  Head: Normocephalic and atraumatic.  Eyes: Pupils are equal, round, and reactive to light. EOM are normal.  Neck: Normal range of motion. Neck supple.  Cardiovascular: Normal rate and regular rhythm.  Pulmonary/Chest: Effort normal. No respiratory distress.  Abdominal: Soft. She exhibits no distension. There is no tenderness. There is no guarding.  Soft abdomen nontender left lateral ostomy is got gas in the bag and stool staining in the bag but not much stool.  The abdomen itself is nontender.  Skin: Skin is warm and dry. No erythema.  Vitals reviewed.     Results for orders placed or performed during the hospital encounter of 03/28/18 (from the past 48 hour(s))  CBC with Differential     Status: Abnormal   Collection Time: 03/28/18  8:50 PM  Result Value Ref Range   WBC 18.9 (H) 3.6 - 11.0 K/uL   RBC 3.87 3.80 - 5.20 MIL/uL   Hemoglobin 10.2 (L) 12.0 - 16.0 g/dL   HCT 30.7 (L) 35.0 - 47.0 %   MCV 79.5 (L) 80.0 - 100.0 fL   MCH 26.4 26.0 - 34.0 pg   MCHC 33.2 32.0 - 36.0 g/dL   RDW 15.8 (H) 11.5 - 14.5 %  Platelets 249 150 - 440 K/uL   Neutrophils Relative % 95 %   Neutro Abs 17.8 (H) 1.4 - 6.5 K/uL   Lymphocytes Relative 2 %   Lymphs Abs 0.4 (L) 1.0 - 3.6 K/uL   Monocytes Relative 3 %   Monocytes Absolute 0.6 0.2 - 0.9 K/uL   Eosinophils Relative 0 %   Eosinophils Absolute 0.0 0 - 0.7 K/uL   Basophils Relative 0 %   Basophils Absolute 0.0 0 - 0.1 K/uL    Comment: Performed at [facility], [address] [place], [place] [address]  Basic metabolic panel     Status: Abnormal   Collection Time: 03/28/18  8:50 PM  Result Value Ref Range   Sodium 135 135 - 145 mmol/L   Potassium 3.2 (L) 3.5 - 5.1 mmol/L   Chloride 94 (L) 98 - 111 mmol/L   CO2  35 (H) 22 - 32 mmol/L   Glucose, Bld 140 (H) 70 - 99 mg/dL   BUN 9 8 - 23 mg/dL   Creatinine, Ser 0.33 (L) 0.44 - 1.00 mg/dL   Calcium 7.7 (L) 8.9 - 10.3 mg/dL   GFR calc non Af Amer >60 >60 mL/min   GFR calc Af Amer >60 >60 mL/min    Comment: (NOTE) The eGFR has been calculated using the CKD EPI equation. This calculation has not been validated in all clinical situations. eGFR's persistently <60 mL/min signify possible Chronic Kidney Disease.    Anion gap 6 5 - 15    Comment: Performed at [facility], [address] [place], [place] [address]  Blood culture (routine x 2)     Status: None (Preliminary result)   Collection Time: 03/28/18 11:35 PM  Result Value Ref Range   Specimen Description BLOOD RIGHT WRIST    Special Requests      BOTTLES DRAWN AEROBIC AND ANAEROBIC Blood Culture adequate volume   Culture      NO GROWTH < 12 HOURS Performed at [facility], [address] [place], [place] [address]    Report Status PENDING   Blood culture (routine x 2)     Status: None (Preliminary result)   Collection Time: 03/28/18 11:35 PM  Result Value Ref Range   Specimen Description BLOOD LEFT FOREARM    Special Requests      BOTTLES DRAWN AEROBIC AND ANAEROBIC Blood Culture adequate volume   Culture      NO GROWTH < 12 HOURS Performed at [facility], [address] [place], [place] [address]    Report Status PENDING   Lactic acid, plasma     Status: None   Collection Time: 03/29/18  2:13 AM  Result Value Ref Range   Lactic Acid, Venous 0.6 0.5 - 1.9 mmol/L    Comment: Performed at [facility], [address] [place], [place] [address]  Basic metabolic panel     Status: Abnormal   Collection Time: 03/29/18  5:06 AM  Result Value Ref Range   Sodium 137 135 - 145 mmol/L   Potassium 2.8 (L) 3.5 - 5.1 mmol/L   Chloride 96 (L) 98 - 111 mmol/L   CO2 37 (H) 22 - 32 mmol/L   Glucose, Bld 99 70 - 99 mg/dL   BUN 8 8 - 23 mg/dL    Creatinine, Ser 0.34 (L) 0.44 - 1.00 mg/dL   Calcium 7.9 (L) 8.9 - 10.3 mg/dL   GFR calc non Af Amer >60 >60 mL/min   GFR calc  Af Amer >60 >60 mL/min    Comment: (NOTE) The eGFR has been calculated using the CKD EPI equation. This calculation has not been validated in all clinical situations. eGFR's persistently <60 mL/min signify possible Chronic Kidney Disease.    Anion gap 4 (L) 5 - 15    Comment: Performed at [facility], [address] [place], [place] [address]  CBC     Status: Abnormal   Collection Time: 03/29/18  5:06 AM  Result Value Ref Range   WBC 18.1 (H) 3.6 - 11.0 K/uL   RBC 3.99 3.80 - 5.20 MIL/uL   Hemoglobin 10.5 (L) 12.0 - 16.0 g/dL   HCT 31.9 (L) 35.0 - 47.0 %   MCV 80.1 80.0 - 100.0 fL   MCH 26.2 26.0 - 34.0 pg   MCHC 32.7 32.0 - 36.0 g/dL   RDW 15.5 (H) 11.5 - 14.5 %   Platelets 252 150 - 440 K/uL    Comment: Performed at [facility], [address] [place], [place] [address]  Lactic acid, plasma     Status: None   Collection Time: 03/29/18  5:06 AM  Result Value Ref Range   Lactic Acid, Venous 0.8 0.5 - 1.9 mmol/L    Comment: Performed at [facility], [address] [place], [place] [address]   Dg Abdomen 1 View  Result Date: 03/29/2018 CLINICAL DATA:  NG tube placement EXAM: ABDOMEN - 1 VIEW COMPARISON:  None. FINDINGS: Enteric tube tip projects over the left upper quadrant consistent with location in the upper stomach. Stool in the visualized colon without distention. Residual contrast material in the renal collecting systems. Lumbar scoliosis and kyphoplasty changes. IMPRESSION: Enteric tube tip projects over the left upper quadrant consistent with location in the upper stomach. Electronically Signed   By: Lucienne Capers M.D.   On: 03/29/2018 00:29   Ct Abdomen Pelvis W Contrast  Result Date: 03/28/2018 CLINICAL DATA:  Vomiting. Tachycardia. Diaphoresis. Colorectal cancer. EXAM: CT ABDOMEN AND PELVIS WITH CONTRAST  TECHNIQUE: Multidetector CT imaging of the abdomen and pelvis was performed using the standard protocol following bolus administration of intravenous contrast. CONTRAST:  56m OMNIPAQUE IOHEXOL 300 MG/ML  SOLN COMPARISON:  02/02/2018 FINDINGS: Lower chest: Micronodular pattern in the lower lungs appears similar on the right and could be due to bronchopneumonia or metastatic disease. Progressive abnormal density at the left base with left effusion and worsened left base density most consistent with pneumonia. Hepatobiliary: Air and the biliary tree as seen previously. No hepatic parenchymal mass lesion is seen. Pancreas: Chronic pancreatic calcification. No acute pancreatic finding. Spleen: Normal Adrenals/Urinary Tract: Adrenal glands are normal. Few tiny nonobstructing renal stones. No acute renal pathology. No hydronephrosis. Bladder appears normal. Stomach/Bowel: Small hiatal hernia. Stomach appears otherwise normal. Left-sided ostomy appears unremarkable. Small bowel in the pelvis shows mild wall thickening as seen previously with mild dilatation. Some fecalized intraluminal material. Likely representing partial small bowel obstruction. Certainly the small bowel is not as dilated as was seen previously. Thickening of the rectum with edema of the surrounding fat. This could be due to treatment effect or tumor extension. Vascular/Lymphatic: Slightly prominent retroperitoneal periaortic nodes could be involved by metastatic disease. Aortic atherosclerosis. No aneurysm. Reproductive: Previous hysterectomy. Other: No free air.  Small amount of free fluid in the pelvis. Musculoskeletal: Previous vertebral augmentation at L3 and L5 as seen previously. Previous partial compression fractures of L1 and L2. No new fracture. IMPRESSION: Persistent evidence of low-grade small bowel obstruction with somewhat  thick-walled dilated small intestine with fecalized material. The small bowel is not as dilated as was seen in July.  Left abdominal ostomy appears unremarkable. Thickening of the rectum with edema of the surrounding fat could be treatment effect or local extension of tumor. Small amount of interloop fluid in the pelvis. Worsened density at the left lung base with pleural fluid and patchy parenchymal density worrisome for pneumonia. Multiple small nodules at the lung bases could relate to pneumonia or metastatic disease. Chronic air in the biliary tree. Slightly prominent retroperitoneal lymph nodes likely involved by metastatic disease. Electronically Signed   By: Nelson Chimes M.D.   On: 03/28/2018 21:38    Assessment/Plan:  Studies from last night and labs of been reviewed. Patient with a bowel obstruction which apparently has resolved itself.  There is gas in the bag with stool in the bag.  Nasogastric tube appears to have been pulled out a considerable distance.  Will obtain a KUB today to assess for any improvement radiographically May be able to remove nasogastric tube and advance to clear liquids potentially. Patient is DNR on hospice on steroids and palliative care is been consulted.  Patient is a very poor surgical candidate with poor prognosis and no long-term goals for treatment.  Surgical intervention would be withheld at this time but could be reevaluated.  Florene Glen, MD, FACS

## 2018-03-29 NOTE — Progress Notes (Signed)
Pharmacy Electrolyte Monitoring Consult:  Pharmacy consulted to assist in monitoring and replacing electrolytes in this 84 y.o. female admitted on 03/28/2018 with Chest Pain   Labs:  Sodium (mmol/L)  Date Value  03/28/2018 135   Potassium (mmol/L)  Date Value  03/28/2018 3.2 (L)   Magnesium (mg/dL)  Date Value  02/06/2018 1.7   Phosphorus (mg/dL)  Date Value  02/05/2018 2.8   Calcium (mg/dL)  Date Value  03/28/2018 7.7 (L)   Albumin (g/dL)  Date Value  02/02/2018 3.3 (L)    Assessment/Plan: Patient admitted for nausea/vomiting and chest pain. Patient is in hypokalemia.  Will replace K+ via fluids w/ NS + 20 mEq KCl @ 75 ml/hr. Will monitor electrolytes w/ am labs.  Tobie Lords, PharmD, BCPS Clinical Pharmacist 03/29/2018

## 2018-03-29 NOTE — Progress Notes (Signed)
Pharmacy Electrolyte Monitoring Consult:  Pharmacy consulted to assist in monitoring and replacing electrolytes in this 84 y.o. female admitted on 03/28/2018 with Chest Pain   Labs:  Sodium (mmol/L)  Date Value  03/29/2018 137   Potassium (mmol/L)  Date Value  03/29/2018 2.8 (L)   Magnesium (mg/dL)  Date Value  02/06/2018 1.7   Phosphorus (mg/dL)  Date Value  02/05/2018 2.8   Calcium (mg/dL)  Date Value  03/29/2018 7.9 (L)   Albumin (g/dL)  Date Value  02/02/2018 3.3 (L)    Assessment/Plan: Patient admitted for nausea/vomiting and chest pain. Patient is in hypokalemia.  Dr Benjie Karvonen would like to aggressively replace K+ PO so patient can be discharged.  Will order KCL 52mEq every 2 hours x 2 doses.   Continue K+ via fluids w/ NS + 20 mEq KCl @ 75 ml/hr.  Erin Horn, PharmD, BCPS Clinical Pharmacist 03/29/2018 9:53 AM

## 2018-03-29 NOTE — H&P (Addendum)
[facility] NAME: Erin Horn    MR#:  [id number]  DATE OF BIRTH:  [birth year]  DATE OF ADMISSION:  03/28/2018  PRIMARY CARE PHYSICIAN: Patient, No Pcp Per   REQUESTING/REFERRING PHYSICIAN:   CHIEF COMPLAINT:   Chief Complaint  Patient presents with  . Chest Pain    HISTORY OF PRESENT ILLNESS: Erin Horn  is a 84 y.o. female with a known history of COPD on chronic 2 L oxygen per nasal cannula and rectal cancer, status post radiation treatment last year.  Patient was not considered a good candidate for further chemotherapy, so the rectal mass, which initially strong status post radiation therapy, begin to slowly grow again.  In February this year, a colostomy was done for comfort.  She was enrolled with hospice before leaving [place] few months ago. Patient was brought to emergency room this time, for abdominal discomfort, nausea and vomiting, going on for the past 24 hours, gradually getting worse.  She was noted to have decreased ostomy output since yesterday.  She had similar symptoms 1 month ago, when she was hospitalized again and diagnosed with SBO. No fever or chills, no bleeding.  Per family, the stool in her ostomy bag was loose with foul odor for the past 7 to 10 days, up until yesterday. Blood test done emergency room are notable for elevated WBC at 18.9.  Potassium is 3.2.  Creatinine is 0.33. Abdominal CAT scan reveals low-grade small bowel obstruction and pneumonia.   PAST MEDICAL HISTORY:   Past Medical History:  Diagnosis Date  . Cancer ([place])    colon  . COPD (chronic obstructive pulmonary disease) ([place])     PAST SURGICAL HISTORY:  Past Surgical History:  Procedure Laterality Date  . ABDOMINAL HYSTERECTOMY    . CHOLECYSTECTOMY    . COLOSTOMY      SOCIAL HISTORY:  Social History   Tobacco Use  . Smoking status: Former Smoker    Types: Cigarettes  . Smokeless tobacco: Never Used  Substance Use  Topics  . Alcohol use: Not Currently    FAMILY HISTORY: No family history on file.  DRUG ALLERGIES: No Known Allergies  REVIEW OF SYSTEMS:   CONSTITUTIONAL: No fever, fatigue or weakness.  EYES: No changes in vision.  EARS, NOSE, AND THROAT: No tinnitus or ear pain.  RESPIRATORY: No cough, shortness of breath, wheezing or hemoptysis.  Patient is on chronic oxygen at home, 2 L per nasal cannula. CARDIOVASCULAR: No chest pain, orthopnea, edema.  GASTROINTESTINAL: Positive for nausea and vomiting, and abdominal pain.  Positive for diarrhea 2 days ago.  Positive history of colon/rectal cancer and colostomy bag. GENITOURINARY: No dysuria, hematuria.  ENDOCRINE: No polyuria, nocturia. HEMATOLOGY: No bleeding. SKIN: No rash or lesion. MUSCULOSKELETAL: No joint pain at this time.   NEUROLOGIC: No focal weakness.  PSYCHIATRY: No anxiety or depression.   MEDICATIONS AT HOME:  Prior to Admission medications   Medication Sig Start Date End Date Taking? Authorizing Provider  acetaminophen (TYLENOL) 325 MG tablet Take 650 mg by mouth every 6 (six) hours as needed.   Yes [provider]  albuterol (PROVENTIL) (2.5 MG/3ML) 0.083% nebulizer solution Take 2.5 mg by nebulization every 6 (six) hours as needed for wheezing or shortness of breath.   Yes [provider]  GUAIFENESIN ER PO Take 600 mg by mouth 2 (two) times daily.   Yes [provider]  haloperidol (HALDOL) 2 MG/ML solution Take 1 mg by  mouth every 4 (four) hours as needed for agitation.   Yes [provider]  hyoscyamine (LEVSIN SL) 0.125 MG SL tablet Place 0.125 mg under the tongue every 4 (four) hours as needed.   Yes [provider]  ipratropium (ATROVENT) 0.02 % nebulizer solution Take 0.5 mg by nebulization every 6 (six) hours as needed for wheezing or shortness of breath.   Yes [provider]  LORazepam (ATIVAN) 0.5 MG tablet Take 0.5 mg by mouth every 4 (four) hours as needed  for anxiety.   Yes [provider]  Morphine Sulfate (MORPHINE CONCENTRATE) 10 mg / 0.5 ml concentrated solution Take 10-20 mg by mouth every 2 (two) hours as needed for severe pain.   Yes [provider]  fluticasone (FLONASE) 50 MCG/ACT nasal spray Place 2 sprays into both nostrils daily.    [provider]  furosemide (LASIX) 40 MG tablet Take 1 tablet (40 mg total) by mouth daily as needed for edema. Worsening swelling on legs or Shortness of breath Patient not taking: Reported on 03/28/2018 02/06/18   Vaughan Basta, MD  gabapentin (NEURONTIN) 100 MG capsule Take 200 mg by mouth 2 (two) times daily.    [provider]  loratadine (CLARITIN) 10 MG tablet Take 10 mg by mouth daily.    [provider]  montelukast (SINGULAIR) 10 MG tablet Take 10 mg by mouth at bedtime.    [provider]  morphine (MS CONTIN) 15 MG 12 hr tablet Take 15 mg by mouth 3 (three) times daily.    [provider]  predniSONE (DELTASONE) 10 MG tablet Take 20 mg by mouth daily with breakfast.    [provider]  sennosides-docusate sodium (SENOKOT-S) 8.6-50 MG tablet Take 1-2 tablets by mouth daily as needed for constipation.    [provider]      PHYSICAL EXAMINATION:   VITAL SIGNS: Blood pressure (!) 165/65, pulse 91, temperature 98.1 F (36.7 C), resp. rate 19, height 5\' 2"  (1.575 m), weight 42.5 kg, SpO2 97 %.  GENERAL:  84 y.o.-year-old patient lying in the bed with no acute distress.  She has oxygen on, 1/2 L per nasal cannula and she has NG tube. EYES: Pupils equal, round, reactive to light and accommodation. No scleral icterus. Extraocular muscles intact.  HEENT: Head atraumatic, normocephalic.  NECK:  Supple, no jugular venous distention. No thyroid enlargement, no tenderness.  LUNGS: Reduced breath sounds bilaterally, no wheezing, rales,rhonchi or crepitation. No use of accessory muscles of respiration.  Oxygen  saturation is 96% on 2-1/2 L per nasal cannula. CARDIOVASCULAR: S1, S2 normal. No S3/S4.  ABDOMEN: Abdomen is soft and slightly distended.  There is tenderness with palpation of the mid abdominal area.  Bowel sounds are diminished.  Colostomy in place, bag is empty. EXTREMITIES: No pedal edema, cyanosis, or clubbing.  NEUROLOGIC: No focal weakness. PSYCHIATRIC: The patient is alert and oriented x 3.  SKIN: Stage I sacral pressure ulcer is noted.   LABORATORY PANEL:   CBC Recent Labs  Lab 03/28/18 2050  WBC 18.9*  HGB 10.2*  HCT 30.7*  PLT 249  MCV 79.5*  MCH 26.4  MCHC 33.2  RDW 15.8*  LYMPHSABS 0.4*  MONOABS 0.6  EOSABS 0.0  BASOSABS 0.0   ------------------------------------------------------------------------------------------------------------------  Chemistries  Recent Labs  Lab 03/28/18 2050  NA 135  K 3.2*  CL 94*  CO2 35*  GLUCOSE 140*  BUN 9  CREATININE 0.33*  CALCIUM 7.7*   ------------------------------------------------------------------------------------------------------------------ estimated creatinine clearance is  33.2 mL/min (A) (by C-G formula based on SCr of 0.33 mg/dL (L)). ------------------------------------------------------------------------------------------------------------------ No results for input(s): TSH, T4TOTAL, T3FREE, THYROIDAB in the last 72 hours.  Invalid input(s): FREET3   Coagulation profile No results for input(s): INR, PROTIME in the last 168 hours. ------------------------------------------------------------------------------------------------------------------- No results for input(s): DDIMER in the last 72 hours. -------------------------------------------------------------------------------------------------------------------  Cardiac Enzymes No results for input(s): CKMB, TROPONINI, MYOGLOBIN in the last 168 hours.  Invalid input(s):  CK ------------------------------------------------------------------------------------------------------------------ Invalid input(s): POCBNP  ---------------------------------------------------------------------------------------------------------------  Urinalysis    Component Value Date/Time   COLORURINE YELLOW (A) 02/02/2018 0908   APPEARANCEUR HAZY (A) 02/02/2018 0908   LABSPEC 1.038 (H) 02/02/2018 0908   PHURINE 6.0 02/02/2018 0908   GLUCOSEU NEGATIVE 02/02/2018 0908   HGBUR MODERATE (A) 02/02/2018 0908   BILIRUBINUR NEGATIVE 02/02/2018 0908   KETONESUR NEGATIVE 02/02/2018 0908   PROTEINUR 30 (A) 02/02/2018 0908   NITRITE NEGATIVE 02/02/2018 0908   LEUKOCYTESUR SMALL (A) 02/02/2018 0908     RADIOLOGY: Dg Abdomen 1 View  Result Date: 03/29/2018 CLINICAL DATA:  NG tube placement EXAM: ABDOMEN - 1 VIEW COMPARISON:  None. FINDINGS: Enteric tube tip projects over the left upper quadrant consistent with location in the upper stomach. Stool in the visualized colon without distention. Residual contrast material in the renal collecting systems. Lumbar scoliosis and kyphoplasty changes. IMPRESSION: Enteric tube tip projects over the left upper quadrant consistent with location in the upper stomach. Electronically Signed   By: Lucienne Capers M.D.   On: 03/29/2018 00:29   Ct Abdomen Pelvis W Contrast  Result Date: 03/28/2018 CLINICAL DATA:  Vomiting. Tachycardia. Diaphoresis. Colorectal cancer. EXAM: CT ABDOMEN AND PELVIS WITH CONTRAST TECHNIQUE: Multidetector CT imaging of the abdomen and pelvis was performed using the standard protocol following bolus administration of intravenous contrast. CONTRAST:  38mL OMNIPAQUE IOHEXOL 300 MG/ML  SOLN COMPARISON:  02/02/2018 FINDINGS: Lower chest: Micronodular pattern in the lower lungs appears similar on the right and could be due to bronchopneumonia or metastatic disease. Progressive abnormal density at the left base with left effusion and  worsened left base density most consistent with pneumonia. Hepatobiliary: Air and the biliary tree as seen previously. No hepatic parenchymal mass lesion is seen. Pancreas: Chronic pancreatic calcification. No acute pancreatic finding. Spleen: Normal Adrenals/Urinary Tract: Adrenal glands are normal. Few tiny nonobstructing renal stones. No acute renal pathology. No hydronephrosis. Bladder appears normal. Stomach/Bowel: Small hiatal hernia. Stomach appears otherwise normal. Left-sided ostomy appears unremarkable. Small bowel in the pelvis shows mild wall thickening as seen previously with mild dilatation. Some fecalized intraluminal material. Likely representing partial small bowel obstruction. Certainly the small bowel is not as dilated as was seen previously. Thickening of the rectum with edema of the surrounding fat. This could be due to treatment effect or tumor extension. Vascular/Lymphatic: Slightly prominent retroperitoneal periaortic nodes could be involved by metastatic disease. Aortic atherosclerosis. No aneurysm. Reproductive: Previous hysterectomy. Other: No free air.  Small amount of free fluid in the pelvis. Musculoskeletal: Previous vertebral augmentation at L3 and L5 as seen previously. Previous partial compression fractures of L1 and L2. No new fracture. IMPRESSION: Persistent evidence of low-grade small bowel obstruction with somewhat thick-walled dilated small intestine with fecalized material. The small bowel is not as dilated as was seen in July. Left abdominal ostomy appears unremarkable. Thickening of the rectum with edema of the surrounding fat could be treatment effect or local extension of tumor. Small amount of interloop fluid in the pelvis. Worsened density at the left lung base with pleural  fluid and patchy parenchymal density worrisome for pneumonia. Multiple small nodules at the lung bases could relate to pneumonia or metastatic disease. Chronic air in the biliary tree. Slightly  prominent retroperitoneal lymph nodes likely involved by metastatic disease. Electronically Signed   By: Nelson Chimes M.D.   On: 03/28/2018 21:38    EKG: Orders placed or performed during the hospital encounter of 03/28/18  . EKG 12-Lead  . EKG 12-Lead  . EKG 12-Lead  . EKG 12-Lead    IMPRESSION AND PLAN:  1.  SBO.  We will keep patient n.p.o., continue NG tube.  General surgery was consulted for further evaluation and treatment. 2.  Acute on chronic respiratory failure, likely secondary to pneumonia.  Continue treatment with oxygen, nebulizer and antibiotics. 3.  HCAP.  Will treat with cefepime and vancomycin IV.  Patient is a very high risk for anesthesia colitis, we will add Flagyl to prevent/treated possible colitis. 4.  Acute, persistent diarrhea with foul odor.  Will check stool for C. Difficile. 5.  Acute COPD exacerbation, secondary to pneumonia.  See treatment as above under #2.  Patient takes 20 mg prednisone p.o. Daily.  She is interested in tapering prednisone dose if possible in the future.  6.  Stage I sacral pressure ulcer.  We will continue management per wound care team. 7.  Rectal cancer, with possible metastasis to the lungs.  Patient is not a candidate for any further therapy.   Overall, long-term prognosis in this patient is very poor. Will consult palliative care team.  All the records are reviewed and case discussed with ED provider. Management plans discussed with the patient, family and they are in agreement.  CODE STATUS: DNR    Code Status Orders  (From admission, onward)         Start     Ordered   03/29/18 0126  Do not attempt resuscitation (DNR)  Continuous    Question Answer Comment  In the event of cardiac or respiratory ARREST Do not call a "code blue"   In the event of cardiac or respiratory ARREST Do not perform Intubation, CPR, defibrillation or ACLS   In the event of cardiac or respiratory ARREST Use medication by any route, position, wound  care, and other measures to relive pain and suffering. May use oxygen, suction and manual treatment of airway obstruction as needed for comfort.      03/29/18 0125        Code Status History    Date Active Date Inactive Code Status Order ID Comments User Context   02/03/2018 0340 02/06/2018 1854 DNR [id number]  Arta Silence, MD Inpatient       TOTAL TIME TAKING CARE OF THIS PATIENT: 50 minutes.    Amelia Jo M.D on 03/29/2018 at 3:24 AM  Between 7am to 6pm - Pager - [phone]  After 6pm go to [email or web address] - password [email or web address] [facility] [place] [facility]  [phone]  CC: Primary care physician; Patient, No Pcp Per

## 2018-03-29 NOTE — Discharge Summary (Addendum)
[facility] NAME: Erin Horn    MR#:  [id number]  DATE OF BIRTH:  30-Jun-1929  DATE OF ADMISSION:  03/28/2018 ADMITTING PHYSICIAN: Amelia Jo, MD  DATE OF DISCHARGE: 03/29/2018  PRIMARY CARE PHYSICIAN: Patient, No Pcp Per    ADMISSION DIAGNOSIS:  Small bowel obstruction ([place]) [K56.609] Healthcare-associated pneumonia [J18.9]  DISCHARGE DIAGNOSIS:  Active Problems:   SBO (small bowel obstruction) (HCC) c diff  SECONDARY DIAGNOSIS:   Past Medical History:  Diagnosis Date  . Cancer ([place])    colon  . COPD (chronic obstructive pulmonary disease) [facility])     HOSPITAL COURSE:  84 year old female with a history of COPD chronic hypoxic respiratory failure on 2 L of nasal cannula and rectal cancer status post radiation currently on hospice who presented to the emergency room due to abdominal discomfort.  1.  Small bowel obstruction which has resolved: Patient was seen and evaluated surgery.  She is a very poor surgical candidate with poor prognosis and no long-term goals for treatment as per surgery.  She did not require surgical intervention. Patient has colostomy bag with stool in the bag  2.  Chronic hypoxic respiratory failure on 2 L oxygen with COPD: Patient will continue oxygen and chronic steroids 3.  Hypokalemia from poor p.o. intake which is resolved  4.  Severe protein calorie malnutrition: Patient would benefit from nutritional supplements 5.  Pneumonia: Patient will be discharged on oral Augmentin  6. c diff PCR was positive. She will need PO Vancomycin for treatment of 14 days (10 days post AUGMENTIN) She has hospice at home Welcome DIET:  ] Guarded condition regular diet  CONSULTS OBTAINED:  Treatment Team:  Florene Glen, MD  DRUG ALLERGIES:  No Known Allergies  DISCHARGE MEDICATIONS:   Allergies as of 03/29/2018   No Known Allergies     Medication List    STOP taking these  medications   furosemide 40 MG tablet Commonly known as:  LASIX     TAKE these medications   acetaminophen 325 MG tablet Commonly known as:  TYLENOL Take 650 mg by mouth every 6 (six) hours as needed.   albuterol (2.5 MG/3ML) 0.083% nebulizer solution Commonly known as:  PROVENTIL Take 2.5 mg by nebulization every 6 (six) hours as needed for wheezing or shortness of breath.   amoxicillin-clavulanate 875-125 MG tablet Commonly known as:  AUGMENTIN Take 1 tablet by mouth every 12 (twelve) hours for 5 days.   fluticasone 50 MCG/ACT nasal spray Commonly known as:  FLONASE Place 2 sprays into both nostrils daily.   gabapentin 100 MG capsule Commonly known as:  NEURONTIN Take 200 mg by mouth 2 (two) times daily.   GUAIFENESIN ER PO Take 600 mg by mouth 2 (two) times daily.   haloperidol 2 MG/ML solution Commonly known as:  HALDOL Take 1 mg by mouth every 4 (four) hours as needed for agitation.   hyoscyamine 0.125 MG SL tablet Commonly known as:  LEVSIN SL Place 0.125 mg under the tongue every 4 (four) hours as needed.   ipratropium 0.02 % nebulizer solution Commonly known as:  ATROVENT Take 0.5 mg by nebulization every 6 (six) hours as needed for wheezing or shortness of breath.   loratadine 10 MG tablet Commonly known as:  CLARITIN Take 10 mg by mouth daily.   LORazepam 0.5 MG tablet Commonly known as:  ATIVAN Take 0.5 mg by mouth every 4 (four) hours as needed for anxiety.  montelukast 10 MG tablet Commonly known as:  SINGULAIR Take 10 mg by mouth at bedtime.   morphine CONCENTRATE 10 mg / 0.5 ml concentrated solution Take 10-20 mg by mouth every 2 (two) hours as needed for severe pain.   morphine 15 MG 12 hr tablet Commonly known as:  MS CONTIN Take 15 mg by mouth 3 (three) times daily.   predniSONE 10 MG tablet Commonly known as:  DELTASONE Take 20 mg by mouth daily with breakfast.   sennosides-docusate sodium 8.6-50 MG tablet Commonly known as:   SENOKOT-S Take 1-2 tablets by mouth daily as needed for constipation.   vancomycin 125 MG capsule Commonly known as:  VANCOCIN Take 1 capsule (125 mg total) by mouth 4 (four) times daily for 16 days.         Today   CHIEF COMPLAINT:   No acute issues overnight SBO seems to have resolved   VITAL SIGNS:  Blood pressure (!) 151/66, pulse 94, temperature 98.1 F (36.7 C), temperature source Oral, resp. rate 18, height 5\' 2"  (1.575 m), weight 42.5 kg, SpO2 92 %.   REVIEW OF SYSTEMS:  Review of Systems  Constitutional: Negative.  Negative for chills, fever and malaise/fatigue.  HENT: Negative.  Negative for ear discharge, ear pain, hearing loss, nosebleeds and sore throat.   Eyes: Negative.  Negative for blurred vision and pain.  Respiratory: Negative.  Negative for cough, hemoptysis, shortness of breath and wheezing.   Cardiovascular: Negative.  Negative for chest pain, palpitations and leg swelling.  Gastrointestinal: Negative.  Negative for abdominal pain, blood in stool, diarrhea, nausea and vomiting.  Genitourinary: Negative.  Negative for dysuria.  Musculoskeletal: Negative.  Negative for back pain.  Skin: Negative.   Neurological: Negative for dizziness, tremors, speech change, focal weakness, seizures and headaches.  Endo/Heme/Allergies: Negative.  Does not bruise/bleed easily.  Psychiatric/Behavioral: Negative.  Negative for depression, hallucinations and suicidal ideas.     PHYSICAL EXAMINATION:  GENERAL:  84 y.o.-year-old patient lying in the bed with no acute distress.  Thin and frail NECK:  Supple, no jugular venous distention. No thyroid enlargement, no tenderness.  LUNGS: Normal breath sounds bilaterally, no wheezing, rales,rhonchi  No use of accessory muscles of respiration.  CARDIOVASCULAR: S1, S2 normal. No murmurs, rubs, or gallops.  ABDOMEN: Soft, non-tender, non-distended. Bowel sounds present. No organomegaly or mass.  Ostomy bag full of  stool EXTREMITIES: No pedal edema, cyanosis, or clubbing.  PSYCHIATRIC: The patient is alert and oriented x 3.  SKIN: No obvious rash, lesion, or ulcer.   DATA REVIEW:   CBC Recent Labs  Lab 03/29/18 0506  WBC 18.1*  HGB 10.5*  HCT 31.9*  PLT 252    Chemistries  Recent Labs  Lab 03/29/18 0506  NA 137  K 2.8*  CL 96*  CO2 37*  GLUCOSE 99  BUN 8  CREATININE 0.34*  CALCIUM 7.9*  MG 1.3*    Cardiac Enzymes No results for input(s): TROPONINI in the last 168 hours.  Microbiology Results  @[email or web address] @  RADIOLOGY:  Dg Abdomen 1 View  Result Date: 03/29/2018 CLINICAL DATA:  NG tube placement EXAM: ABDOMEN - 1 VIEW COMPARISON:  None. FINDINGS: Enteric tube tip projects over the left upper quadrant consistent with location in the upper stomach. Stool in the visualized colon without distention. Residual contrast material in the renal collecting systems. Lumbar scoliosis and kyphoplasty changes. IMPRESSION: Enteric tube tip projects over the left upper quadrant consistent with location in the upper stomach. Electronically Signed  By: Lucienne Capers M.D.   On: 03/29/2018 00:29   Ct Abdomen Pelvis W Contrast  Result Date: 03/28/2018 CLINICAL DATA:  Vomiting. Tachycardia. Diaphoresis. Colorectal cancer. EXAM: CT ABDOMEN AND PELVIS WITH CONTRAST TECHNIQUE: Multidetector CT imaging of the abdomen and pelvis was performed using the standard protocol following bolus administration of intravenous contrast. CONTRAST:  55mL OMNIPAQUE IOHEXOL 300 MG/ML  SOLN COMPARISON:  02/02/2018 FINDINGS: Lower chest: Micronodular pattern in the lower lungs appears similar on the right and could be due to bronchopneumonia or metastatic disease. Progressive abnormal density at the left base with left effusion and worsened left base density most consistent with pneumonia. Hepatobiliary: Air and the biliary tree as seen previously. No hepatic parenchymal mass lesion is seen. Pancreas: Chronic pancreatic  calcification. No acute pancreatic finding. Spleen: Normal Adrenals/Urinary Tract: Adrenal glands are normal. Few tiny nonobstructing renal stones. No acute renal pathology. No hydronephrosis. Bladder appears normal. Stomach/Bowel: Small hiatal hernia. Stomach appears otherwise normal. Left-sided ostomy appears unremarkable. Small bowel in the pelvis shows mild wall thickening as seen previously with mild dilatation. Some fecalized intraluminal material. Likely representing partial small bowel obstruction. Certainly the small bowel is not as dilated as was seen previously. Thickening of the rectum with edema of the surrounding fat. This could be due to treatment effect or tumor extension. Vascular/Lymphatic: Slightly prominent retroperitoneal periaortic nodes could be involved by metastatic disease. Aortic atherosclerosis. No aneurysm. Reproductive: Previous hysterectomy. Other: No free air.  Small amount of free fluid in the pelvis. Musculoskeletal: Previous vertebral augmentation at L3 and L5 as seen previously. Previous partial compression fractures of L1 and L2. No new fracture. IMPRESSION: Persistent evidence of low-grade small bowel obstruction with somewhat thick-walled dilated small intestine with fecalized material. The small bowel is not as dilated as was seen in July. Left abdominal ostomy appears unremarkable. Thickening of the rectum with edema of the surrounding fat could be treatment effect or local extension of tumor. Small amount of interloop fluid in the pelvis. Worsened density at the left lung base with pleural fluid and patchy parenchymal density worrisome for pneumonia. Multiple small nodules at the lung bases could relate to pneumonia or metastatic disease. Chronic air in the biliary tree. Slightly prominent retroperitoneal lymph nodes likely involved by metastatic disease. Electronically Signed   By: Nelson Chimes M.D.   On: 03/28/2018 21:38   Dg Abd 2 Views  Result Date:  03/29/2018 CLINICAL DATA:  Small bowel obstruction. EXAM: ABDOMEN - 2 VIEW COMPARISON:  Radiograph of March 29, 2018. FINDINGS: Distal tip of nasogastric tube is seen in expected position of stomach. No abnormal bowel gas pattern is noted. No pneumoperitoneum is noted. IMPRESSION: Distal tip of nasogastric tube seen in expected position of stomach. No definite evidence of bowel obstruction or ileus. Electronically Signed   By: Marijo Conception, M.D.   On: 03/29/2018 10:36      Allergies as of 03/29/2018   No Known Allergies     Medication List    STOP taking these medications   furosemide 40 MG tablet Commonly known as:  LASIX     TAKE these medications   acetaminophen 325 MG tablet Commonly known as:  TYLENOL Take 650 mg by mouth every 6 (six) hours as needed.   albuterol (2.5 MG/3ML) 0.083% nebulizer solution Commonly known as:  PROVENTIL Take 2.5 mg by nebulization every 6 (six) hours as needed for wheezing or shortness of breath.   amoxicillin-clavulanate 875-125 MG tablet Commonly known as:  AUGMENTIN Take  1 tablet by mouth every 12 (twelve) hours for 5 days.   fluticasone 50 MCG/ACT nasal spray Commonly known as:  FLONASE Place 2 sprays into both nostrils daily.   gabapentin 100 MG capsule Commonly known as:  NEURONTIN Take 200 mg by mouth 2 (two) times daily.   GUAIFENESIN ER PO Take 600 mg by mouth 2 (two) times daily.   haloperidol 2 MG/ML solution Commonly known as:  HALDOL Take 1 mg by mouth every 4 (four) hours as needed for agitation.   hyoscyamine 0.125 MG SL tablet Commonly known as:  LEVSIN SL Place 0.125 mg under the tongue every 4 (four) hours as needed.   ipratropium 0.02 % nebulizer solution Commonly known as:  ATROVENT Take 0.5 mg by nebulization every 6 (six) hours as needed for wheezing or shortness of breath.   loratadine 10 MG tablet Commonly known as:  CLARITIN Take 10 mg by mouth daily.   LORazepam 0.5 MG tablet Commonly known as:   ATIVAN Take 0.5 mg by mouth every 4 (four) hours as needed for anxiety.   montelukast 10 MG tablet Commonly known as:  SINGULAIR Take 10 mg by mouth at bedtime.   morphine CONCENTRATE 10 mg / 0.5 ml concentrated solution Take 10-20 mg by mouth every 2 (two) hours as needed for severe pain.   morphine 15 MG 12 hr tablet Commonly known as:  MS CONTIN Take 15 mg by mouth 3 (three) times daily.   predniSONE 10 MG tablet Commonly known as:  DELTASONE Take 20 mg by mouth daily with breakfast.   sennosides-docusate sodium 8.6-50 MG tablet Commonly known as:  SENOKOT-S Take 1-2 tablets by mouth daily as needed for constipation.   vancomycin 125 MG capsule Commonly known as:  VANCOCIN Take 1 capsule (125 mg total) by mouth 4 (four) times daily for 16 days.         Management plans discussed with the patient and she is in agreement. Stable for discharge home with hospice  Patient should follow up with PCP CODE STATUS:     Code Status Orders  (From admission, onward)         Start     Ordered   03/29/18 0126  Do not attempt resuscitation (DNR)  Continuous    Question Answer Comment  In the event of cardiac or respiratory ARREST Do not call a "code blue"   In the event of cardiac or respiratory ARREST Do not perform Intubation, CPR, defibrillation or ACLS   In the event of cardiac or respiratory ARREST Use medication by any route, position, wound care, and other measures to relive pain and suffering. May use oxygen, suction and manual treatment of airway obstruction as needed for comfort.      03/29/18 0125        Code Status History    Date Active Date Inactive Code Status Order ID Comments User Context   02/03/2018 0340 02/06/2018 1854 DNR [id number]  Arta Silence, MD Inpatient      TOTAL TIME TAKING CARE OF THIS PATIENT: 38 minutes.    Note: This dictation was prepared with Dragon dictation along with smaller phrase technology. Any transcriptional errors that  result from this process are unintentional.  Malakhai Beitler M.D on 03/29/2018 at 12:42 PM  Between 7am to 6pm - Pager - [phone] After 6pm go to [email or web address] - password [email or web address] [facility]  Office  [phone]  CC: Primary care physician; Patient, No Pcp Per

## 2018-03-29 NOTE — Consult Note (Signed)
[place] Nurse ostomy follow up Stoma type/location: LLQ colostomy, existing. Admitted with obstruction that appears to have resolved per MD notes and copious stool effluent in pouch and on bed.  Pouch is leaking and will be changed.   Stomal assessment/size: 1 1 /2" pink  Peristomal assessment: intact, below rib cage, will use barrier ring due to flush stoma Treatment options for stomal/peristomal skin: barrier ring Output Large soft brown stool Ostomy pouching: 2pc. 2 3/4" pouch with barrier ring  Ostomy belt used as well.  Education provided: none  Enrolled patient in [employer] Discharge program: No Will not follow at this time.  Please re-consult if needed.  Domenic Moras MSN, RN, FNP-BC CWON Wound, Ostomy, Continence Nurse Pager [phone]

## 2018-03-29 NOTE — Progress Notes (Signed)
Visit made. Patient is currently followed by [facility] at home with a hospice diagnosis of rectal cancer. She is a DNR code. With out of facility DNR in place in the home. Patient was brought to the [facility] ED for evaluation of possible small bowel obstruction. Patient seen lying in bed, alert and oriented, some what forgetful. She reports feeling "better" than yesterday. Nasogastric tube remains in place. Breakfast tray at bedside, patient is eating and drinking with NG tube in place. Per chart note review and discussion with attending physician Dr. Benjie Karvonen plan is for discharge home today, no surgical intervention planned as patient is not a candidate for surgery and obstruction appears to have resolved. Writer spoke at length with patient's grand daughter Lenna Sciara out side of the room when she arrived. Concerns addressed. Per staff RN Brandi plan continues for discharge home follow administration of IV electrolyte replacement is complete. Hospice team updated. Flo Shanks RN, BSN, [facility] [place], hospital liaison [phone]

## 2018-04-02 LAB — CULTURE, BLOOD (ROUTINE X 2)
CULTURE: NO GROWTH
Culture: NO GROWTH
SPECIAL REQUESTS: ADEQUATE
SPECIAL REQUESTS: ADEQUATE

## 2019-03-18 IMAGING — CT CT ABD-PELV W/ CM
2 of 5 series · 16 of 46 positions shown, 18 images · IV contrast (APPLIED)
Comparison: None.

CLINICAL DATA: Vomiting and mid abdominal pain today. Colostomy due
to rectal cancer. Decreased stool in the bag.

EXAM:
CT ABDOMEN AND PELVIS WITH CONTRAST
TECHNIQUE: Multidetector CT imaging of the abdomen and pelvis was performed
using the standard protocol following bolus administration of
intravenous contrast.
CONTRAST:  75mL OMNIPAQUE IOHEXOL 300 MG/ML  SOLN

[Series 2: routine abd/pel with · axial · 0.65mm/px · z∈[-629,-299]mm · 13 of 74 slices shown, 15 images]
[im 4/74  soft-tissue]
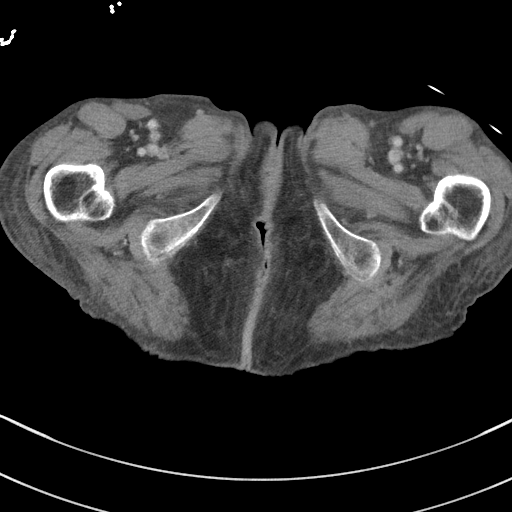
[im 4/74  bone]
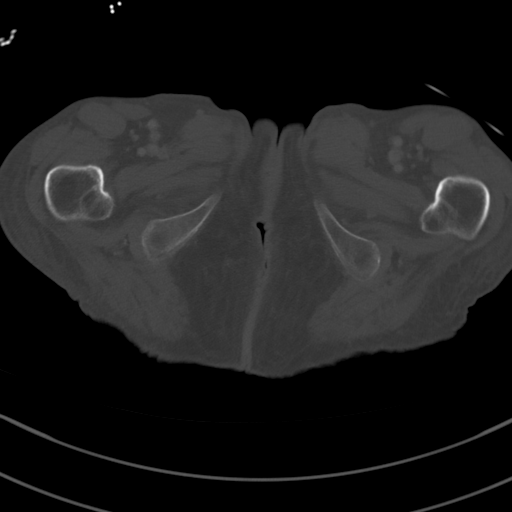
[im 12/74  soft-tissue]
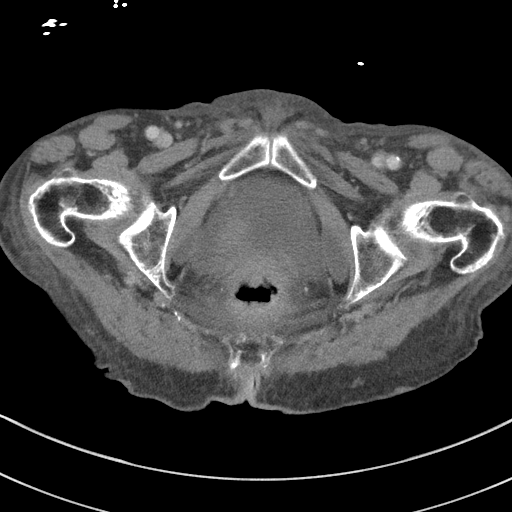
[im 16/74  soft-tissue]
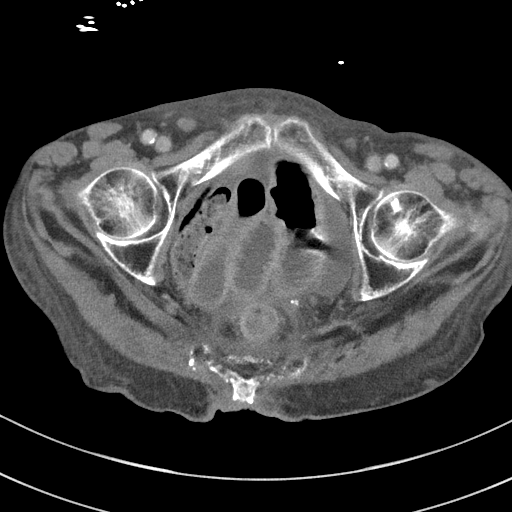
[im 20/74  soft-tissue]
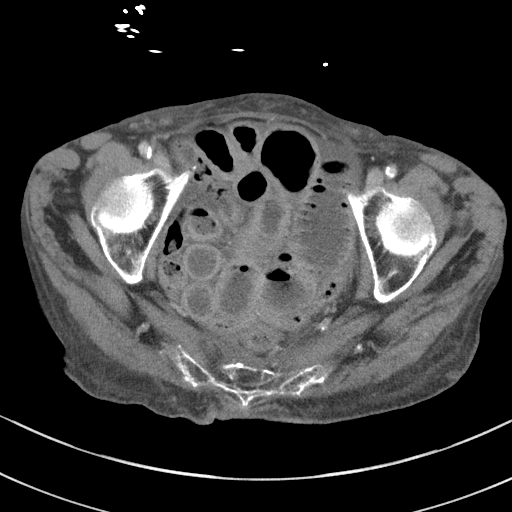
[im 27/74  soft-tissue]
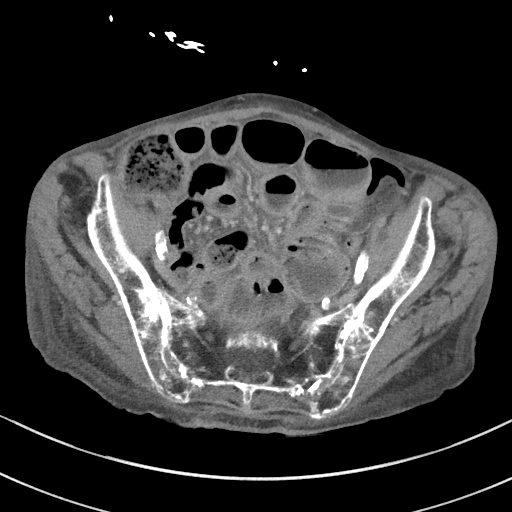
[im 31/74  soft-tissue]
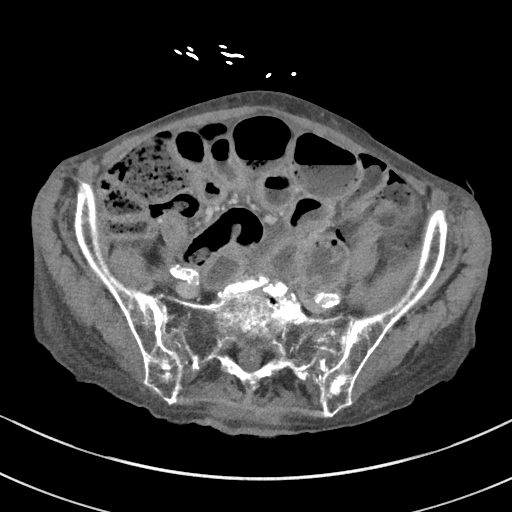
[im 39/74  soft-tissue]
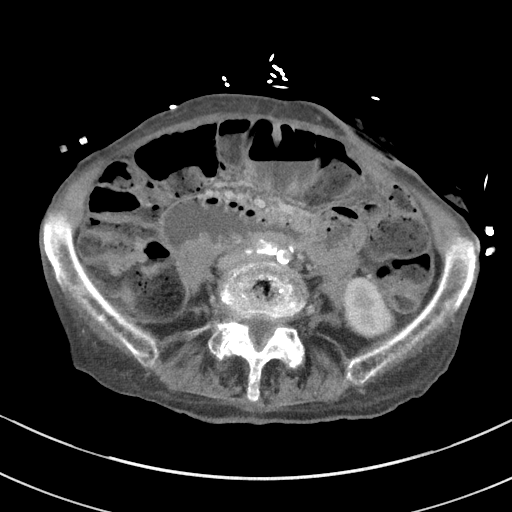
[im 43/74  soft-tissue]
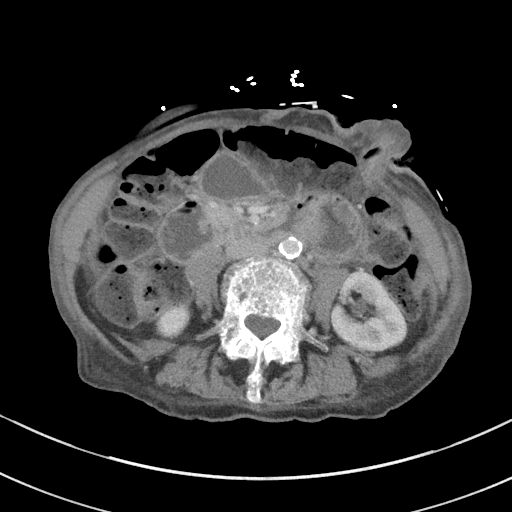
[im 47/74  soft-tissue]
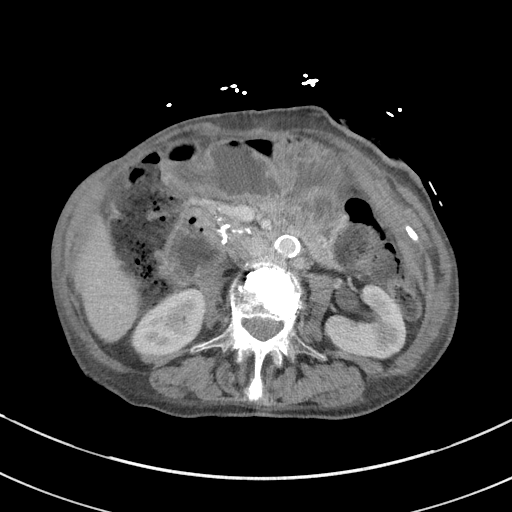
[im 47/74  bone]
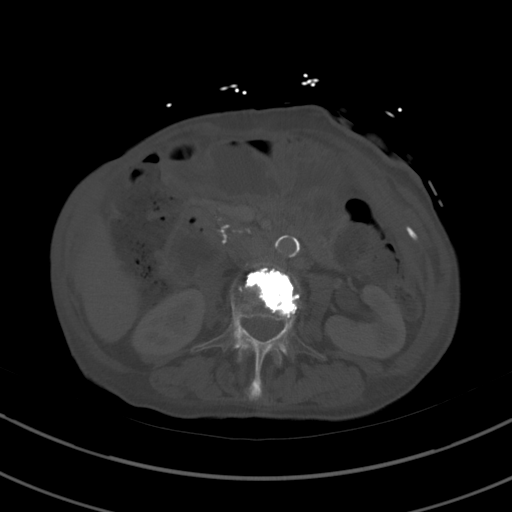
[im 54/74  soft-tissue]
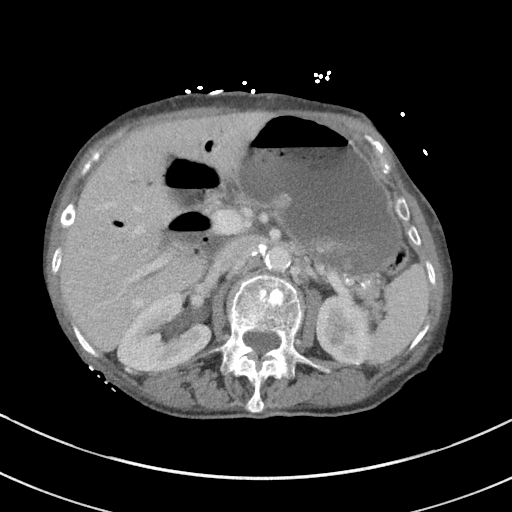
[im 58/74  soft-tissue]
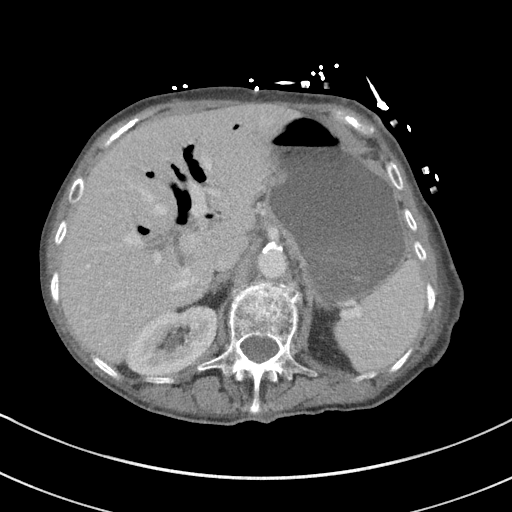
[im 62/74  soft-tissue]
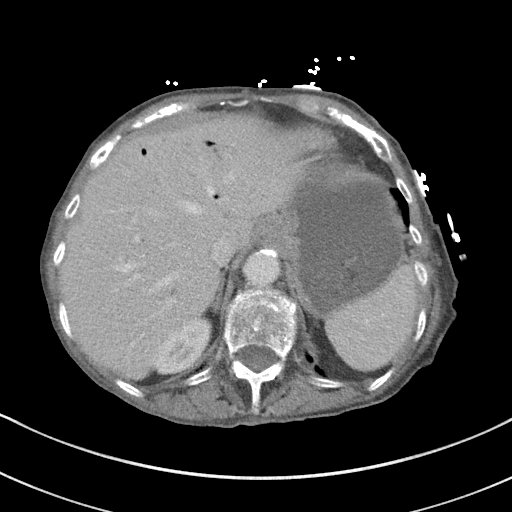
[im 70/74  soft-tissue]
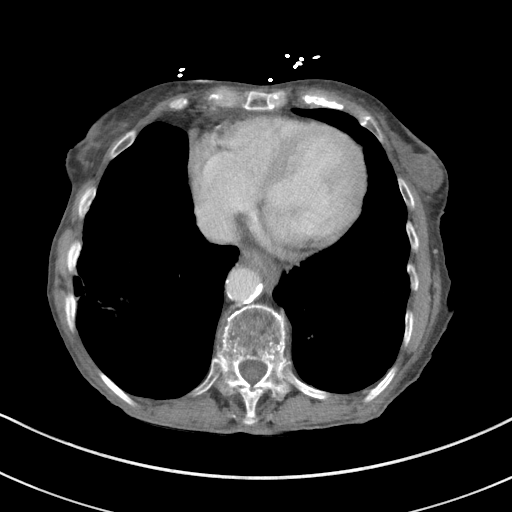

[Series 5: coronal st · coronal · 0.69mm/px · 3 of 78 slices shown]
[im 26/78  soft-tissue]
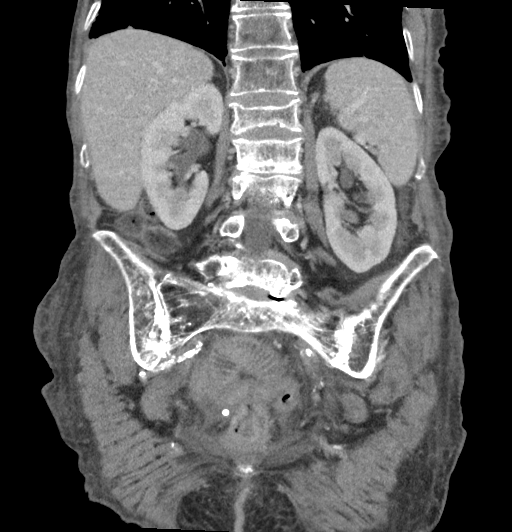
[im 35/78  soft-tissue]
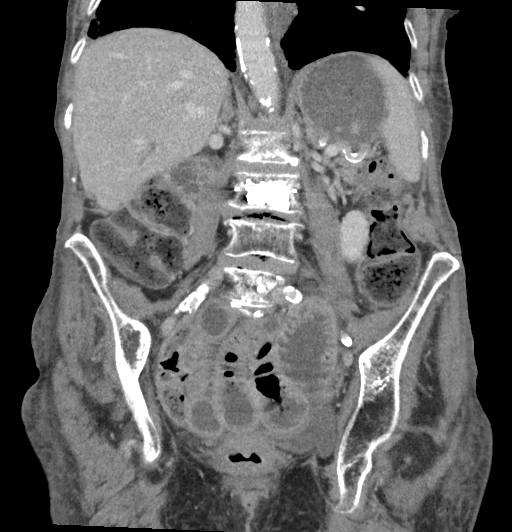
[im 43/78  soft-tissue]
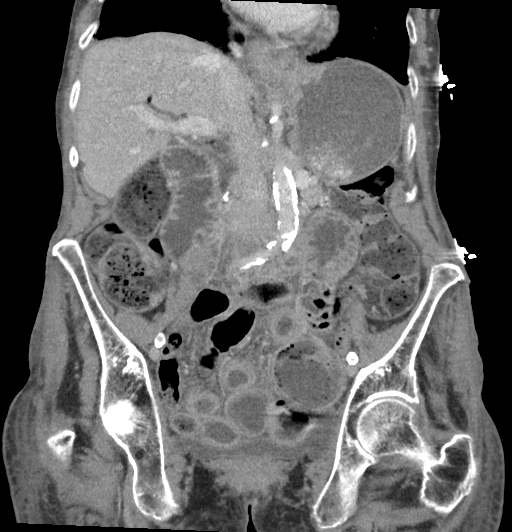

[16 of 46 positions shown; findings below may reference images not displayed]

FINDINGS: Lower chest: Emphysematous changes and fibrosis in the lungs.
Nodular peribronchial infiltrates bilaterally but more prominent on
the right. This likely indicates bronchopneumonia. Small esophageal
hiatal hernia.

Hepatobiliary: Moderate bile duct dilatation with prominent
pneumobilia, likely postoperative. Surgical absence of the
gallbladder. No focal liver lesions identified. No portal venous
gas.

Pancreas: Scattered pancreatic calcifications likely indicating
chronic pancreatitis. No acute inflammatory infiltration is
identified. No pancreatic ductal dilatation.

Spleen: Normal in size without focal abnormality.

Adrenals/Urinary Tract: Adrenal glands are unremarkable. Kidneys are
normal, without renal calculi, focal lesion, or hydronephrosis.
Bladder is decompressed.

Stomach/Bowel: Partial colonic resection with descending colostomy
in the left lower quadrant. Remaining colon is stool filled with
mild distention. Multiple dilated small bowel loops with multiple
air-fluid levels and diffuse small bowel wall thickening. No
pneumatosis. Changes likely to indicate small bowel obstruction with
enteritis. Transition zone is not identified. Flow is demonstrated
in the mesenteric arteries and veins. Bowel ischemia less likely.

Vascular/Lymphatic: Extensive aortic and branch vessel
calcifications. Probably at least moderate stenosis of the iliac
arteries bilaterally. No aneurysm. No significant lymphadenopathy.

Reproductive: Status post hysterectomy. No adnexal masses.

Other: Small amount of free fluid in the pelvis is likely reactive.
No free air.

Musculoskeletal: Degenerative changes in the spine. Diffuse bone
demineralization. Multiple vertebral compression fractures with
kyphoplasty at L3 and L5. Changes likely represent osteoporosis.
IMPRESSION: 1. Multiple dilated small bowel loops with multiple air-fluid levels
and diffuse small bowel wall thickening. Changes likely to indicate
small bowel obstruction with enteritis. Transition zone is not
identified.
2. Small amount of free fluid in the pelvis is likely reactive.
3. Emphysematous changes and fibrosis in the lungs. Nodular
peribronchial infiltrates in the lung bases, more prominent on the
right, likely bronchopneumonia.
4. Extensive aortic and branch vessel calcifications.
5. Partial colonic resection with descending colostomy in the left
lower quadrant.
6. Prominent pneumobilia, likely postoperative.
7. Diffuse bone demineralization with multiple vertebral compression
fractures likely indicating osteoporosis.

Emphysema ([id number]).

## 2019-05-12 IMAGING — DX DG ABDOMEN 1V
1 series · 1 of 1 positions shown · non-contrast
Comparison: None.

CLINICAL DATA: NG tube placement

EXAM:
ABDOMEN - 1 VIEW

[abdomen supine]
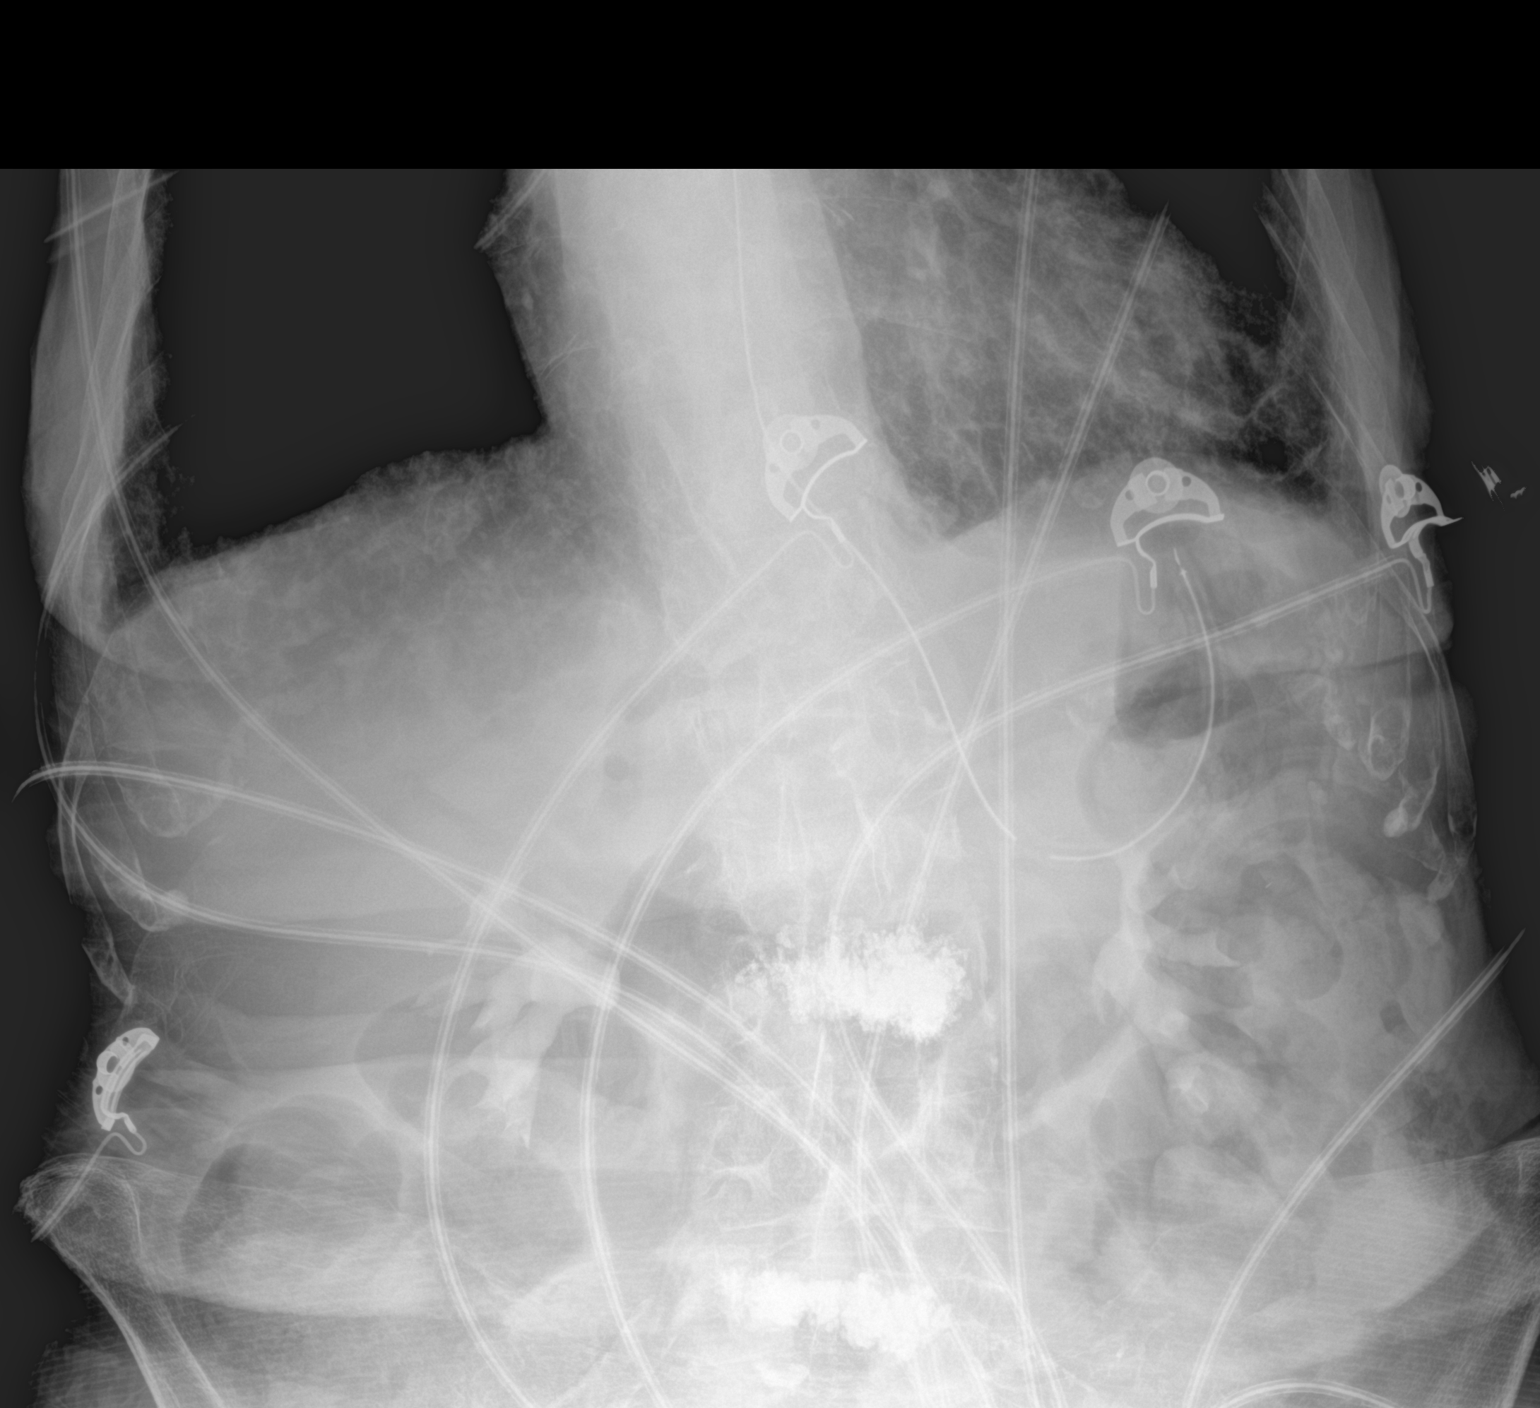

[1 of 1 positions shown; findings below may reference images not displayed]

FINDINGS: Enteric tube tip projects over the left upper quadrant consistent
with location in the upper stomach. Stool in the visualized colon
without distention. Residual contrast material in the renal
collecting systems. Lumbar scoliosis and kyphoplasty changes.
IMPRESSION: Enteric tube tip projects over the left upper quadrant consistent
with location in the upper stomach.
